# Patient Record
Sex: Male | Born: 1955 | Race: White | Hispanic: No | Smoking: Current every day smoker
Health system: Southern US, Community
[De-identification: ages and names within clinical notes are randomized; demographics above are authoritative.]

## PROBLEM LIST (undated history)

## (undated) DIAGNOSIS — I2510 Atherosclerotic heart disease of native coronary artery without angina pectoris: Secondary | ICD-10-CM

## (undated) DIAGNOSIS — J45909 Unspecified asthma, uncomplicated: Secondary | ICD-10-CM

## (undated) DIAGNOSIS — I255 Ischemic cardiomyopathy: Secondary | ICD-10-CM

## (undated) DIAGNOSIS — J449 Chronic obstructive pulmonary disease, unspecified: Secondary | ICD-10-CM

## (undated) DIAGNOSIS — I10 Essential (primary) hypertension: Secondary | ICD-10-CM

## (undated) HISTORY — PX: CARDIAC SURGERY: SHX584

## (undated) HISTORY — PX: CORONARY ARTERY BYPASS GRAFT: SHX141

---

## 2001-06-17 ENCOUNTER — Encounter: Payer: Self-pay | Admitting: Neurosurgery

## 2001-06-22 ENCOUNTER — Ambulatory Visit (HOSPITAL_COMMUNITY): Admission: RE | Admit: 2001-06-22 | Discharge: 2001-06-23 | Payer: Self-pay | Admitting: Neurosurgery

## 2001-06-22 ENCOUNTER — Encounter: Payer: Self-pay | Admitting: Neurosurgery

## 2001-07-14 ENCOUNTER — Ambulatory Visit (HOSPITAL_COMMUNITY): Admission: RE | Admit: 2001-07-14 | Discharge: 2001-07-14 | Payer: Self-pay | Admitting: Neurosurgery

## 2001-07-14 ENCOUNTER — Encounter: Payer: Self-pay | Admitting: Neurosurgery

## 2003-09-02 ENCOUNTER — Other Ambulatory Visit: Payer: Self-pay

## 2004-07-27 ENCOUNTER — Emergency Department: Payer: Self-pay | Admitting: Emergency Medicine

## 2005-04-25 ENCOUNTER — Emergency Department: Payer: Self-pay | Admitting: Emergency Medicine

## 2005-11-03 ENCOUNTER — Inpatient Hospital Stay: Payer: Self-pay | Admitting: Internal Medicine

## 2005-11-03 ENCOUNTER — Other Ambulatory Visit: Payer: Self-pay

## 2005-12-07 ENCOUNTER — Other Ambulatory Visit: Payer: Self-pay

## 2005-12-07 ENCOUNTER — Inpatient Hospital Stay: Payer: Self-pay | Admitting: Internal Medicine

## 2006-05-24 ENCOUNTER — Other Ambulatory Visit: Payer: Self-pay

## 2006-05-24 ENCOUNTER — Inpatient Hospital Stay: Payer: Self-pay | Admitting: Internal Medicine

## 2010-03-14 ENCOUNTER — Ambulatory Visit: Payer: Self-pay | Admitting: Internal Medicine

## 2010-08-05 ENCOUNTER — Inpatient Hospital Stay: Payer: Self-pay | Admitting: Internal Medicine

## 2010-12-25 ENCOUNTER — Emergency Department: Payer: Self-pay | Admitting: Emergency Medicine

## 2013-01-18 ENCOUNTER — Inpatient Hospital Stay: Payer: Self-pay | Admitting: Internal Medicine

## 2013-01-18 LAB — BASIC METABOLIC PANEL
Anion Gap: 7 (ref 7–16)
BUN: 12 mg/dL (ref 7–18)
Calcium, Total: 9.1 mg/dL (ref 8.5–10.1)
Chloride: 106 mmol/L (ref 98–107)
Co2: 26 mmol/L (ref 21–32)
Glucose: 87 mg/dL (ref 65–99)
Osmolality: 277 (ref 275–301)
Sodium: 139 mmol/L (ref 136–145)

## 2013-01-18 LAB — CK TOTAL AND CKMB (NOT AT ARMC): CK, Total: 76 U/L (ref 35–232)

## 2013-01-18 LAB — CBC WITH DIFFERENTIAL/PLATELET
Basophil #: 0.1 10*3/uL (ref 0.0–0.1)
Eosinophil #: 0.2 10*3/uL (ref 0.0–0.7)
Eosinophil %: 2.2 %
HCT: 46.4 % (ref 40.0–52.0)
Lymphocyte #: 2.1 10*3/uL (ref 1.0–3.6)
MCH: 30.8 pg (ref 26.0–34.0)
Monocyte %: 10.7 %
Neutrophil %: 66.6 %
WBC: 10.9 10*3/uL — ABNORMAL HIGH (ref 3.8–10.6)

## 2013-01-18 LAB — PROTIME-INR: Prothrombin Time: 12.6 secs (ref 11.5–14.7)

## 2013-01-19 LAB — LIPID PANEL
Cholesterol: 176 mg/dL (ref 0–200)
HDL Cholesterol: 33 mg/dL — ABNORMAL LOW (ref 40–60)
Ldl Cholesterol, Calc: 122 mg/dL — ABNORMAL HIGH (ref 0–100)
Triglycerides: 105 mg/dL (ref 0–200)
VLDL Cholesterol, Calc: 21 mg/dL (ref 5–40)

## 2013-01-19 LAB — COMPREHENSIVE METABOLIC PANEL
Albumin: 3.1 g/dL — ABNORMAL LOW (ref 3.4–5.0)
Anion Gap: 5 — ABNORMAL LOW (ref 7–16)
BUN: 11 mg/dL (ref 7–18)
Bilirubin,Total: 0.2 mg/dL (ref 0.2–1.0)
Calcium, Total: 8.4 mg/dL — ABNORMAL LOW (ref 8.5–10.1)
Chloride: 108 mmol/L — ABNORMAL HIGH (ref 98–107)
Creatinine: 0.79 mg/dL (ref 0.60–1.30)
EGFR (African American): >60
EGFR (Non-African Amer.): >60
Glucose: 81 mg/dL (ref 65–99)
Osmolality: 278 (ref 275–301)
SGOT(AST): 14 U/L — ABNORMAL LOW (ref 15–37)
Sodium: 140 mmol/L (ref 136–145)
Total Protein: 6.4 g/dL (ref 6.4–8.2)

## 2013-01-20 LAB — BASIC METABOLIC PANEL
Anion Gap: 3 — ABNORMAL LOW (ref 7–16)
Chloride: 103 mmol/L (ref 98–107)
Co2: 30 mmol/L (ref 21–32)
Potassium: 3.8 mmol/L (ref 3.5–5.1)

## 2013-02-24 ENCOUNTER — Ambulatory Visit: Payer: Self-pay | Admitting: Internal Medicine

## 2013-02-25 LAB — CK-MB: CK-MB: 2 ng/mL (ref 0.5–3.6)

## 2013-02-25 LAB — BASIC METABOLIC PANEL
Anion Gap: 1 — ABNORMAL LOW (ref 7–16)
BUN: 12 mg/dL (ref 7–18)
Calcium, Total: 8.7 mg/dL (ref 8.5–10.1)
Chloride: 104 mmol/L (ref 98–107)
Co2: 33 mmol/L — ABNORMAL HIGH (ref 21–32)
Creatinine: 0.79 mg/dL (ref 0.60–1.30)
EGFR (African American): >60
EGFR (Non-African Amer.): >60
Glucose: 89 mg/dL (ref 65–99)
Osmolality: 275 (ref 275–301)
Potassium: 4.1 mmol/L (ref 3.5–5.1)
Sodium: 138 mmol/L (ref 136–145)

## 2013-02-28 ENCOUNTER — Emergency Department: Payer: Self-pay | Admitting: Emergency Medicine

## 2013-03-21 ENCOUNTER — Ambulatory Visit: Payer: Self-pay | Admitting: Family Medicine

## 2013-03-23 ENCOUNTER — Ambulatory Visit: Payer: Self-pay | Admitting: Specialist

## 2013-03-31 ENCOUNTER — Ambulatory Visit: Payer: Self-pay | Admitting: Vascular Surgery

## 2013-03-31 LAB — CBC
HCT: 44.5 % (ref 40.0–52.0)
HGB: 15.6 g/dL (ref 13.0–18.0)
MCH: 31.5 pg (ref 26.0–34.0)
MCHC: 35 g/dL (ref 32.0–36.0)
MCV: 90 fL (ref 80–100)
Platelet: 242 10*3/uL (ref 150–440)
RBC: 4.94 10*6/uL (ref 4.40–5.90)
RDW: 14.2 % (ref 11.5–14.5)
WBC: 10.9 10*3/uL — ABNORMAL HIGH (ref 3.8–10.6)

## 2013-03-31 LAB — BASIC METABOLIC PANEL
Anion Gap: 4 — ABNORMAL LOW (ref 7–16)
BUN: 8 mg/dL (ref 7–18)
Calcium, Total: 9 mg/dL (ref 8.5–10.1)
Chloride: 105 mmol/L (ref 98–107)
Co2: 27 mmol/L (ref 21–32)
Creatinine: 0.52 mg/dL — ABNORMAL LOW (ref 0.60–1.30)
EGFR (African American): >60
EGFR (Non-African Amer.): >60
Glucose: 81 mg/dL (ref 65–99)
Osmolality: 269 (ref 275–301)
Potassium: 4 mmol/L (ref 3.5–5.1)
Sodium: 136 mmol/L (ref 136–145)

## 2013-04-07 ENCOUNTER — Inpatient Hospital Stay: Payer: Self-pay | Admitting: Vascular Surgery

## 2013-04-07 LAB — TROPONIN I: Troponin-I: <0.02 ng/mL

## 2013-04-07 LAB — CK TOTAL AND CKMB (NOT AT ARMC)
CK, Total: 78 U/L (ref 35–232)
CK-MB: 1.3 ng/mL (ref 0.5–3.6)

## 2013-04-07 LAB — MAGNESIUM: Magnesium: 1.8 mg/dL

## 2013-04-07 LAB — POTASSIUM: Potassium: 3.9 mmol/L (ref 3.5–5.1)

## 2013-04-08 LAB — CBC WITH DIFFERENTIAL/PLATELET
Basophil #: 0.1 x10 3/mm 3
Basophil %: 0.7 %
Eosinophil #: 0.1 x10 3/mm 3
Eosinophil %: 0.7 %
HCT: 39.7 % — ABNORMAL LOW
HGB: 13.8 g/dL
Lymphocyte %: 13 %
Lymphs Abs: 1.3 x10 3/mm 3
MCH: 31.3 pg
MCHC: 34.8 g/dL
MCV: 90 fL
Monocyte #: 0.8 "x10 3/mm "
Monocyte %: 8.2 %
Neutrophil #: 8 x10 3/mm 3 — ABNORMAL HIGH
Neutrophil %: 77.4 %
Platelet: 214 x10 3/mm 3
RBC: 4.42 x10 6/mm 3
RDW: 13.9 %
WBC: 10.3 x10 3/mm 3

## 2013-04-08 LAB — BASIC METABOLIC PANEL
BUN: 8 mg/dL (ref 7–18)
Calcium, Total: 8.3 mg/dL — ABNORMAL LOW (ref 8.5–10.1)
EGFR (Non-African Amer.): >60
Glucose: 112 mg/dL — ABNORMAL HIGH (ref 65–99)
Potassium: 3.9 mmol/L (ref 3.5–5.1)
Sodium: 139 mmol/L (ref 136–145)

## 2013-04-08 LAB — APTT: Activated PTT: 35.4 s

## 2013-04-08 LAB — TROPONIN I
Troponin-I: <0.02 ng/mL
Troponin-I: <0.02 ng/mL

## 2014-11-24 NOTE — Consult Note (Signed)
CHIEF COMPLAINT and HISTORY:  Subjective/Chief Complaint slurred speech, facial droop, right sided weakness   History of Present Illness 59 yo WM with COPD, CAD, admitted with slurred speech, confusion, facial issues and right sided weakness.  Most of symptoms have resolved over about 48 hours, but a little slurred speech seems to persist.  Weakness has resolved.  He was on ASA. He was found to have an approximately 90% left ICA stenosis by [place] and CT which I have reviewed.   PAST MEDICAL/SURGICAL HISTORY:  Past Medical History:   Bronchitis:    COPD:    Hypercholesterolemia:    mi:    blood clot:    open heart surgery:    cerv disk surg:   ALLERGIES:  Allergies:  Sulfa drugs: Rash  HOME MEDICATIONS:  Home Medications: Medication Instructions Status  atorvastatin 40 mg oral tablet 1 tab(s) orally once a day (at bedtime) Active  clopidogrel 75 mg oral tablet 1 tab(s) orally once a day Active  Aspirin Low Dose 81 mg oral delayed release tablet 1 tab(s) orally once a day Active  lisinopril 10 mg oral tablet 1 tab(s) orally once a day Active  Metoprolol Tartrate 25 mg oral tablet 1 tab(s) orally 2 times a day Active  Combivent Respimat CFC free 100 mcg-20 mcg/inh inhalation aerosol 1 puff(s) inhaled 4 times a day, As Needed - for Shortness of Breath Active   Family and Social History:  Family History Coronary Artery Disease  Hypertension  Smoking   Social History positive  tobacco   + Tobacco Current (within 1 year)   Place of Living Home   Review of Systems:  Fever/Chills No   Cough No   Sputum No   Abdominal Pain No   Diarrhea No   Constipation No   Nausea/Vomiting No   SOB/DOE Yes   Chest Pain No   Telemetry Reviewed NSR   Dysuria No   Tolerating PT Yes   Tolerating Diet Yes   Medications/Allergies Reviewed Medications/Allergies reviewed   Physical Exam:  GEN well developed, well nourished   HEENT pink conjunctivae, moist oral mucosa    NECK No masses  trachea midline   RESP normal resp effort  no use of accessory muscles   CARD regular rate  positive carotid bruits   VASCULAR ACCESS none   ABD denies tenderness  normal BS   LYMPH negative neck, negative axillae   EXTR negative cyanosis/clubbing, negative edema   SKIN No ulcers, skin turgor good   NEURO motor/sensory function intact, mild speech slurring at this point   [facility] alert, A+O to time, place, person   LABS:  Laboratory Results: LabObservation:    18-Jun-14 07:56, Echo Doppler  OBSERVATION   Reason for Test  Hepatic:    18-Jun-14 05:25, Comprehensive Metabolic Panel  Bilirubin, Total 0.2  Alkaline Phosphatase 77  SGPT (ALT) 14  SGOT (AST) 14  Total Protein, Serum 6.4  Albumin, Serum 3.1  Cardiology:    17-Jun-14 16:25, ECG  Ventricular Rate 69  Atrial Rate 258  QRS Duration 80  QT 404  QTc 432  P Axis 62  R Axis 11  T Axis -22  ECG interpretation   Atrial flutter with variable A-V block with premature ventricular or aberrantly conducted complexes  Nonspecific T wave abnormality  Abnormal ECG  When compared with ECG of 25-Dec-2010 22:15,  Atrial flutter has replaced Sinus rhythm  T wave inversion less evident in Inferior leads  ----------unconfirmed----------  Confirmed by OVERREAD, NOT ([id number]),  editor PEARSON, BARBARA ([id number]) on 01/19/2013 12:34:32 PM  ECG     18-Jun-14 07:56, Echo Doppler  Echo Doppler   REASON FOR EXAM:      COMMENTS:       PROCEDURE: [place] - ECHO DOPPLER COMPLETE(TRANSTHOR)  - Jan 19 2013  7:56AM     RESULT: Echocardiogram Report    Patient Name:   Mario Diaz Date of Exam: 01/19/2013  Medical Rec #:  [id number]             Custom1:  Date of Birth:  1956-04-29          Height:       68.0 in  Patient Age:    74 years           Weight:       172.0 lb  Patient Gender: M                  BSA:          1.92 m??    Indications: CVA  Sonographer:    Janalee Dane RCS  Referring Phys: Max Sane,  S    Summary:   1. Left ventricular ejection fraction, by visual estimation, is 35 to   40%.   2. Mildly to moderately decreased global left ventricular systolic   function.   3. Mild mitral valve regurgitation.   4. Mildly increased leftventricular internal cavity size.   5. Decreased left ventricular posterior wall thickness.   6. Mild tricuspid regurgitation.  2D AND M-MODE MEASUREMENTS (normal ranges within parentheses):  Left Ventricle:          Normal  IVSd (2D):      1.00 cm (0.7-1.1)  LVPWd (2D):     0.55 cm (0.7-1.1) Aorta/LA:                  Normal  LVIDd (2D):     5.38 cm (3.4-5.7) Aortic Root (2D): 3.80 cm (2.4-3.7)  LVIDs (2D):     4.48 cm           Left Atrium (2D): 4.70 cm (1.9-4.0)  LV FS (2D):     16.7 %   (>25%)  LV EF (2D):     34.7 %   (>50%)                                    Right Ventricle:                                    RVd (2D):  LV SYSTOLIC FUNCTION BY 2D PLANIMETRY (MOD):  EF-A4C View: 34.5 % EF-A2C View: 38.9 % EF-Biplane: 47.6 %  LV DIASTOLICFUNCTION:  MV Peak E: 0.50 m/s E/e' Ratio: 7.10  MV Peak A: 0.79 m/s Decel Time: 345 msec  E/A Ratio: 0.63  SPECTRAL DOPPLER ANALYSIS (where applicable):  Mitral Valve:  MV P1/2 Time: 100.05 msec  MV Area, PHT: 2.20 cm??  Tricuspid Valve and PA/RV Systolic Pressure: TR Max Velocity: 2.44 m/s RA   Pressure: 5 mmHg RVSP/PASP: 28.7 mmHg    PHYSICIAN INTERPRETATION:  Left Ventricle: The left ventricular internal cavity size was mildly   increased. LV posterior wall thickness was decreased. Global LV systolic   function was mildly to moderately decreased. Left ventricular ejection   fraction, by visual estimation, is 35 to 40%.  Right Ventricle: Normal right ventricular size, wall thickness, and   systolic function. RV wall thickness is normal.  Left Atrium: The left atrium is normal in size and structure.  Right Atrium: The right atrium is normal in size and structure.  Pericardium: There is no evidence of  pericardial effusion.  Mitral Valve: Mild mitral valve regurgitation is seen.  TricuspidValve: Mild tricuspid regurgitation is visualized. The   tricuspid regurgitant velocity is 2.44 m/s, and with an assumed right   atrial pressure of 5 mmHg, the estimated right ventricular systolic   pressure is normal at 28.7 mmHg.  Aortic Valve: Theaortic valve is trileaflet and structurally normal,   with normal leaflet excursion; without any evidence of aortic stenosis or   insufficiency.  Pulmonic Valve: Structurally normal pulmonic valve, with normal leaflet   excursion.  Aorta: The aortic root and ascending aorta are structurally normal, with   no evidence of dilitation.  Shunts: There is no evidence of a patent foramen ovale. There is no   evidence of an atrial septal defect.    [id number] Serafina Royals MD  Electronically signed by [id number] Serafina Royals MD  Signature Date/Time: 01/19/2013/12:46:27 PM    *** Final ***    IMPRESSION: .        Verified By: Corey Skains  ([facility]), M.D., MD  Routine Chem:    17-Jun-14 76:80, Basic Metabolic Panel (w/Total Calcium)  Glucose, Serum 87  BUN 12  Creatinine (comp) 0.87  Sodium, Serum 139  Potassium, Serum 3.8  Chloride, Serum 106  CO2, Serum 26  Calcium (Total), Serum 9.1  Anion Gap 7  Osmolality (calc) 277  eGFR (African American) >60  eGFR (Non-African American) >60  eGFR values <89m/min/1.73 m2 may be an indication of chronic  kidney disease (CKD).  Calculated eGFR is useful in patients with stable renal function.  The eGFR calculation will not be reliable in acutely ill patients  when serum creatinine is changing rapidly. It is not useful in   patients on dialysis. The eGFR calculation may not be applicable  to patients at the low and high extremes of body sizes, pregnant  women, and vegetarians.    18-Jun-14 05:25, Comprehensive Metabolic Panel  Glucose, Serum 81  BUN 11  Creatinine (comp) 0.79  Sodium, Serum 140   Potassium, Serum 3.9  Chloride, Serum 108  CO2, Serum 27  Calcium (Total), Serum 8.4  Osmolality (calc) 278  eGFR (African American) >60  eGFR (Non-African American) >60  eGFR values <667mmin/1.73 m2 may be an indication of chronic  kidney disease (CKD).  Calculated eGFR is useful in patients with stable renal function.  The eGFR calculation will not be reliable in acutely ill patients  when serum creatinine is changing rapidly. It is not useful in   patients on dialysis. The eGFR calculation may not be applicable  to patients at the low and high extremes of body sizes, pregnant  women, and vegetarians.  Anion Gap 5    18-Jun-14 05:25, Lipid Profile ([facility])  Cholesterol, Serum 176  Triglycerides, Serum 105  HDL (INHOUSE) 33  VLDL Cholesterol Calculated 21  LDL Cholesterol Calculated 122  Result(s) reported on 19 Jan 2013 at 06:13AM.    19-Jun-14 0888:11Basic Metabolic Panel (w/Total Calcium)  Glucose, Serum 91  BUN 7  Creatinine (comp) 0.82  Sodium, Serum 136  Potassium, Serum 3.8  Chloride, Serum 103  CO2, Serum 30  Calcium (Total), Serum 9.2  Anion Gap 3  Osmolality (calc) 270  eGFR (  African American) >60  eGFR (Non-African American) >60  eGFR values <71m/min/1.73 m2 may be an indication of chronic  kidney disease (CKD).  Calculated eGFR is useful in patients with stable renal function.  The eGFR calculation will not be reliable in acutely ill patients  when serum creatinine is changing rapidly. It is not useful in   patients on dialysis. The eGFR calculation may not be applicable  to patients at the low and high extremes of body sizes, pregnant  women, and vegetarians.  Cardiac:    17-Jun-14 16:21, Cardiac Panel  CK, Total 76  CPK-MB, Serum 1.1  Result(s) reported on 18 Jan 2013 at 08:05PM.    17-Jun-14 16:21, Troponin I  Troponin I < 0.02  0.00-0.05  0.05 ng/mL or less: NEGATIVE   Repeat testing in 3-6 hrs   if clinically indicated.  >0.05 ng/mL:  POTENTIAL   MYOCARDIAL INJURY. Repeat   testing in 3-6 hrs if   clinically indicated.  NOTE: An increase or decrease   of 30% or more on serial   testing suggests a   clinically important change  Routine Coag:    17-Jun-14 16:21, Prothrombin Time  Prothrombin 12.6  INR 0.9  INR reference interval applies to patients on anticoagulant therapy.  A single INR therapeutic range for coumarins is not optimal for all  indications; however, the suggested range for most indications is  2.0 - 3.0.  Exceptions to the INR Reference Range may include: Prosthetic heart  valves, acute myocardial infarction, prevention of myocardial  infarction, and combinations of aspirin and anticoagulant. The need  for a higher or lower target INR must be assessed individually.  Reference: The Pharmacology and Management of the Vitamin K   antagonists: the seventh ACCP Conference on Antithrombotic and  Thrombolytic Therapy. CQPYPP.5093Sept:126 (3suppl): [id number]  A HCT value >55% may artifactually increase the PT.  In one study,   the increase was an average of 25%.  Reference:  "Effect on Routine and Special Coagulation Testing Values  of Citrate Anticoagulant Adjustment in Patients with High HCT Values."  American Journal of Clinical Pathology 2006;126:400-405.  Routine Hem:    17-Jun-14 16:21, CBC Profile  WBC (CBC) 10.9  RBC (CBC) 5.15  Hemoglobin (CBC) 15.8  Hematocrit (CBC) 46.4  Platelet Count (CBC) 220  MCV 90  MCH 30.8  MCHC 34.2  RDW 14.2  Neutrophil % 66.6  Lymphocyte % 19.3  Monocyte % 10.7  Eosinophil % 2.2  Basophil % 1.2  Neutrophil # 7.3  Lymphocyte # 2.1  Monocyte # 1.2  Eosinophil # 0.2  Basophil # 0.1  Result(s) reported on 18 Jan 2013 at 04:53PM.   RADIOLOGY:  Radiology Results: [place]    17-Jun-14 19:23, [place]  [place] Bilateral  REASON FOR EXAM:    cva  COMMENTS:       PROCEDURE: [place] - [place] DOPPL[place] BILATERAL  - Jan 18 2013   7:23PM     RESULT:     Findings: Mixed, smooth and calcified plaque is identified within the   common carotid artery on the right and left demonstrating less than 50%   stenosis. Calcified plaque is identified within the carotid bulbs   bilaterally demonstrating less than 50% stenosis. Calcified plaques are   identified within the internal carotid artery on the right demonstrating   less than 50% stenosis. Smooth plaque is identified within the proximal   left internal carotid artery demonstrating visually greater than 75%  stenosis.  Color filling and SPECTRAL waveform imaging within the right system is   unremarkable. Evaluation of the left system demonstrates color filling of   vessels. There is diffuse SPECTRAL broadening within the proximal portion   of the left internal carotid artery consistent with increased laminar   flow in the region of the smoothly marginated mural plaque. Color filling   on the lumen is identified within this area though minimal. There does   not appear to be evidence of flow reversal. Markedly elevated velocities   are identified within the mid left internal carotid artery of 289.1   cm/sec.    ICA/CCAratios:    Right: 1.13  Left: 6.23  Antegrade flow is identified within the right and left vertebral arteries.    IMPRESSION:      1. Sonographic findings consistent with high-grade stenosis within the   proximal portion of the left internal carotid artery on the magnitude of   75% to 95%.  2. No evidence of hemodynamically significant stenosis within the right   carotid system.   3. Vascular Interventional surgical consultation is recommended.    Thank you for the opportunity to contribute to the care of your patient.         Verified By: Mikki Santee, M.D., MD  LabUnknown:    17-Jun-14 16:00, CT Head Without Contrast  PACS Image    17-Jun-14 19:23, US Carotid Doppler Bilateral  PACS Image    18-Jun-14 15:16, MRI Brain Without Contrast   PACS Image    18-Jun-14 16:04, CT Angiography Neck (Carotids)  PACS Image    19-Jun-14 10:33, CT Chest With Contrast  PACS Image  MRI:    18-Jun-14 15:16, MRI Brain Without Contrast  MRI Brain Without Contrast  REASON FOR EXAM:    CVA  COMMENTS:       PROCEDURE: MR  - MR BRAIN WO CONTRAST  - Jan 19 2013  3:16PM     RESULT: History: CVA.    Comparison Study: Head CT of 01/18/2013.    Findings: Multiplanar, multisequence images brain is obtained. Acute   infarct noted in the left posterior temporal lobe is present. White   matter changes consistent with chronic ischemia. No mass or   hydrocephalus.Vascular flow are normal. Mucosal thickening noted in the   ethmoidal and maxillary sinuses.  IMPRESSION:  Acute infarct left temporal parietal lobe.        Verified By: Osa Craver, M.D., MD  CT:    17-Jun-14 16:00, CT Head Without Contrast  CT Head Without Contrast  REASON FOR EXAM:    possible CVA  COMMENTS:       PROCEDURE: CT  - CT HEAD WITHOUT CONTRAST  - Jan 18 2013  4:00PM     RESULT: Noncontrast emergent CT of the brain is performed. The patient   has no previous exam for comparison. The ventricles and sulci are normal.   There is no hemorrhage. There is no focal mass, mass-effect or midline   shift. There is no evidence of edema or territorial infarct. The bone   windows demonstrate normal aeration of the paranasal sinuses and mastoid   air cells. There is no skull fracture demonstrated.    IMPRESSION:    1. No acute intracranial abnormality.  Dictation Site: 1        Verified By: Sundra Aland, M.D., MD    18-Jun-14 16:04, CT Angiography Neck (Carotids)  CT Angiography Neck (Carotids)  REASON FOR EXAM:  carotid stenosis, stroke  COMMENTS:       PROCEDURE: CT  - CT ANGIOGRAPHY NECK W/CONTRAST  - Jan 19 2013  4:04PM     RESULT: History: Stroke. Carotid stenosis.    Comparison Study: Carotid ultrasound 01/18/2013.    Findings: Standard CTA  obtained with 100 cc of Isovue-370. Evaluation   with vessel view software performed. Evaluation of the axial source   images and MIP images performed. Volume rendered images reviewed. Soft   tissue density noted left external auditory canal most consistent with   cerumen. Mild wall thickening is noted of the upper thoracic esophagus.   Esophagitis could present in this fashion. Esophageal malignancy cannot     be entirely excluded. A barium swallow can be obtained for further   evaluation.Pleural parenchymal thickening is noted in the left upper   lobe. This is adjacent to a median sternotomy most likely represents   scarring however chest CT should be considered to exclude significant   mass lesion. Bullous COPD present. Prior cervical spine fusion.     Visualized thoracic arch is unremarkable.    Right carotid system is widely patent with mild calcified plaque carotid   bifurcation.    High-grade 90% stenosis present in the proximal left internal carotid   artery just at the left carotid bifurcation.    Vertebrals are patent. Right vertebral is dominant. Visualized     intracranial vessels  widely patent.    IMPRESSION:    1. High-grade left proximal internal carotid 90% stenosis.  2. Mild thickening of the proximal esophagus. Barium swallow is suggested   to exclude focal esophageal lesion.  3. Ill-defined pleural-parenchymal thickening in the left anterior chest   adjacent to median sternotomy is most likely scarring. Chest CT is   suggested to further evaluate for thepossibility of mass lesion. COPD.        Verified By: Osa Craver, M.D., MD    19-Jun-14 10:33, CT Chest With Contrast  CT Chest With Contrast  REASON FOR EXAM:    lung mass  COMMENTS:       PROCEDURE: CT  - CT CHEST WITH CONTRAST  - Jan 20 2013 10:33AM     RESULT: History: Lung mass.    Comparison Study: CTA 01/19/2013.    Findings: Standard CT of the chest is obtained. Shotty mediastinal lymph    nodes are noted. A thoracic aorta normal caliber. Adrenals are normal.   Prior CABG. Borderline cardiomegaly. Pulmonary arteries are normal.   Patient's had a prior median sternotomy with diastases of the sternum   with broken wires. Present identified soft tissue thickening noted   posterior to the sternum is most likely related to scarring. Severe     bullous COPD is noted. An ill-defined 1.3 cm density is present in the   superior segment of the right lower lobe seen on lung windows image#53.   Description or focal area of pneumonia however focal malignancy cannot be   excluded. Prominent atelectasis and/or pneumonia is noted in the right   lung base. Adrenals are normal. No acute bony abnormalities.    IMPRESSION:    1. 1.3 cm ill-defined density right lower lobe. This could represent a   small lung cancer. Small focus of pneumonia could present in this fashion.  2. Right lower lobe atelectasis and/or pneumonia.  3. Previously identified pleural thickening posterior to the sternum is   most likely related to scarring. There is diastases of the sternum.   Sternotomy  wires are broken.    A followup CT and 2 to 4 weeks should be considered. If the  densities in     the right lung base do not clear, PET CT may prove useful for further   evaluation.        Verified By: Osa Craver, M.D., MD   ASSESSMENT AND PLAN:  Assessment/Admission Diagnosis high grade left ICA stenosis with stroke tobacco dependence COPD CAD   Plan discussed options for treatment.   With high grade symptomatic stenosis, stroke risk reduction favors intervention.  His anatomy is favorable for left CEA.  He appears to be acceptable risk for surgery, but will likely need cardiac clearance as an outpatient.   Generally wait about two weeks following stroke to reduce risk of reperfusion injury. Will arrange left CEA as outpatient in 2 weeks.  level 4   Electronic Signatures: Algernon Huxley (MD)   (Signed 19-Jun-14 13:32)  Authored: Chief Complaint and History, PAST MEDICAL/SURGICAL HISTORY, ALLERGIES, HOME MEDICATIONS, Family and Social History, Review of Systems, Physical Exam, LABS, RADIOLOGY, Assessment and Plan   Last Updated: 19-Jun-14 13:32 by Algernon Huxley (MD)

## 2014-11-24 NOTE — Op Note (Signed)
PATIENT NAME:  Mario Diaz, Mario Diaz MR#:  [id number] DATE OF BIRTH:  [birth year]  DATE OF PROCEDURE:  04/07/2013  PREOPERATIVE DIAGNOSES: 1.  Symptomatic high-grade left carotid artery stenosis with previous stroke.  2.  Coronary disease, status post drug-eluting stent recently.  3.  Chronic obstructive pulmonary disease.   POSTOPERATIVE DIAGNOSES: 1.  Symptomatic high-grade left carotid artery stenosis with previous stroke.  2.  Coronary disease, status post drug-eluting stent recently.  3.  Chronic obstructive pulmonary disease.   PROCEDURE: 1.  Catheter placement into the left internal carotid artery from right femoral approach.  2.  Thoracic aortogram and selective left cervical and cerebral carotid arteriogram.  3.  Placement of a 9 x 7 Xact stent with the use of the NAV6 Embolic Protection device to the left internal carotid artery.  4.  StarClose Closure Device of right femoral artery.   SURGEON: Annice NeedyJason S Chyenne Sobczak, MD   ANESTHESIA: Local with moderate conscious sedation.   ESTIMATED BLOOD LOSS: Approximately 25 mL.   INDICATION FOR PROCEDURE: This is a 59 year old white male with a greater than 90% left carotid artery stenosis. He had a stroke about 3 months ago and has mild residual deficits but has largely recovered. In his cardiac workup for carotid intervention, he was found to have significant coronary disease and recently had a drug-eluting stent and will need to remain on Plavix. Due to his heart disease and the markedly increased risk of bleeding on Plavix, he was felt to be pretty high risk for open carotid surgery, and we elected to perform carotid artery stenting. The risks and benefits were discussed with the patient, and he desired to proceed.   DESCRIPTION OF PROCEDURE: The patient was brought to the vascular interventional radiology suite. Groins were shaved and prepped and a sterile surgical field was created. The right femoral head was localized with fluoroscopy, and the right  femoral artery was accessed without difficulty with a Seldinger needle. A J-wire and 6-French sheaths were placed. The patient was systemically heparinized with 5000 units of intravenous heparin, and his ACT was found to be greater than 300 seconds. A pigtail catheter was placed in the ascending aorta, and an LAO projection thoracic aortogram was performed. This showed normal origins of the great vessels with a moderate reverse curve of the left common carotid artery. A Bentson-Hannafee Catheter was used when the JB-2 catheter would not track into the common carotid artery.  At this point, selective cervical and cerebral carotid angiography was performed. This showed a nearly occlusive stenosis of the left internal carotid artery of 95 to 98% with a smaller internal carotid artery distally and minimal intracerebral flow.  Only some sluggish middle cerebral filling was seen. No anterior cerebral filling was seen.  A Stiff Wire was placed into the external carotid artery, and a 6-French shuttle sheath was placed over the Stiff Wire.  The wire and dilator were then removed. The small NAV6 Embolic Protection Device was then used to cross the lesion, which was done without difficulty, and the embolic protection device was parked in the distal internal carotid artery.  A 97, 4 cm long Xact stent was then selected. It was brought into the field, brought across the lesion.  Selective angiogram was performed, and the stent was deployed in excellent location encompassing the lesion.  It was postdilated with a 5 mm balloon with pretreatment with atropine. He still had slowing of his heart rate afterwards, and an additional dose of atropine was given. The initial  images after stent placement showed significant spasm.  The Embolic Protection Device was then retrieved, which improved but did not resolve the spasm. The stent was widely patent with excellent flow and no significant residual stenosis. The intracerebral filling was  much more brisk in the middle cerebral artery, but there was still no anterior cerebral artery filling seen. At this point, we elected to terminate the procedure. The sheaths were pulled back to the ipsilateral external iliac artery, and oblique arteriogram was performed. StarClose closure device was deployed in the usual fashion with excellent hemostatic result. The patient tolerated the procedure well and was taken to the recovery room in stable condition.    ____________________________ Annice Needy, MD jsd:cb D: 04/07/2013 16:33:59 ET T: 04/07/2013 17:49:49 ET JOB#: [id number]  cc: Annice Needy, MD, <Dictator> Annice Needy MD ELECTRONICALLY SIGNED 04/27/2013 11:37

## 2014-11-24 NOTE — Consult Note (Signed)
Chief Complaint:  Subjective/Chief Complaint Pt doing reasonable well today. No cp no sob. R groin ok.   VITAL SIGNS/ANCILLARY NOTES: **Vital Signs.:   25-Jul-14 07:00  Vital Signs Type Routine  Temperature Temperature (F) 98  Celsius 36.6  Temperature Source oral  Pulse Pulse 68  Respirations Respirations 24  Systolic BP Systolic BP 161  Diastolic BP (mmHg) Diastolic BP (mmHg) 72  Mean BP 84  Pulse Ox % Pulse Ox % 94  Pulse Ox Activity Level  At rest  Oxygen Delivery 2L  Pulse Ox Heart Rate 68  *Intake and Output.:   Daily 25-Jul-14 07:00  Grand Totals Intake:  1750 Output:  3250    Net:  -1500 24 Hr.:  -1500  IV (Primary)      In:  1750  Urine ml     Out:  3250  Length of Stay Totals Intake:  1750 Output:  3250    Net:  -1500   Brief Assessment:  GEN well developed, well nourished, no acute distress   Cardiac Regular  murmur present  -- JVD   Respiratory normal resp effort   Gastrointestinal Normal   Gastrointestinal details normal Soft   EXTR negative cyanosis/clubbing, negative edema   Lab Results: Cardiac Catherization:  24-Jul-14 12:03   Cardiac Catheterization  [facility] [place], [place] [address] [phone]   Cardiovascular Catheterization Comprehensive Report   Patient: ANGELDEJESUS CALLAHAM Study date: 02/24/2013 MR number: [id number] Account number: [id number]   DOB: [birth year] Age: 59 years Gender: Male Race: White Height: 68.1 in Weight: 167.6 lb   Diagnostic Cardiologist:  Serafina Royals, MD   SUMMARY:   -IMPRESSIONS: Progressive angina with abnormal stress test with inferior ishcemia and apical lv dysfuncion with ef 40% Occluded grafts with significant stenosis of rca   CORONARY CIRCULATION: Ostial left main: There was a 30 % stenosis. Proximal LAD: There was a 60 % stenosis. In a second lesion, there was a 40 % stenosis. Mid LAD: There was a 30 %stenosis. Proximal circumflex: There was a 50 % stenosis. In a  second lesion, there was a 100 % stenosis. Proximal ramus intermedius: There was a 60 % stenosis. Proximal RCA: There was a 50 % stenosis. Mid RCA: There was a 30 % stenosis. In a second lesion, there was a 90 % stenosis. Distal RCA: There was a 25 % stenosis. RECOMMENDATIONS: -PCI of rca   INDICATIONS: Angina/MI: stable angina.   HISTORY: The patient has hypertension, a history of current cigarette use, and a family history of coronary artery disease. There was no history of diabetes. PRIOR CARDIOVASCULAR PROCEDURES: No history of coronary or graft percutaneous intervention.   PROCEDURE: The risks and alternatives of the procedures and conscious sedation were explained to the patient and informed consent was obtained. The patient was brought to the cath lab and placed on the table. The planned puncture sites were prepped and draped in the usual sterile fashion. PROCEDURE COMPLETION: There were no complications. TIMING: Test started at 12:20. Test concluded at 12:37.   Prepared and signed by   Serafina Royals, MD Signed 02/24/2013 12:58:11   STUDY DIAGRAM   Angiographic findings Native coronary lesions:  Ostial left main: Lesion 1: 30 % stenosis.  Proximal LAD: Lesion 1: 60 % stenosis. Lesion 2: 40 % stenosis.  Mid LAD: Lesion 1: 30 % stenosis. Proximal circumflex: Lesion 1: 50 % stenosis. Lesion 2: 100 % stenosis.  Proximal RI: Lesion 1: 60 % stenosis.  Proximal RCA: Lesion  1: 50 %stenosis.  Mid RCA: Lesion 1: 30 % stenosis. Lesion 2: 90 % stenosis.  Distal RCA: Lesion 1: 25 % stenosis.   HEMODYNAMIC TABLES   Pressures:  Baseline Pressures:  - HR: 66 Pressures:  - Rhythm: Pressures:  -- Aortic Pressure (S/D/M): 103/45/25 Pressures:  -- Left Ventricle (s/edp): 97/9/--   Outputs:  Baseline Outputs:  -- CALCULATIONS[age] Outputs:  -- CALCULATIONS: Body Surface Area: 1.90 Outputs:  -- CALCULATIONS: Height in cm: 173.00 Outputs:  -- CALCULATIONS:Sex:  Male Outputs:  -- CALCULATIONS: Weight in kg: 76.20  Cardiology:  24-Jul-14 13:51   Ventricular Rate 60  Atrial Rate 60  P-R Interval 230  QRS Duration 92  QT 442  QTc 442  P Axis 58  R Axis 78  T Axis 101  ECG interpretation Sinus rhythm with 1st degree A-V block Incomplete right bundle branch block Nonspecific T wave abnormality Abnormal ECG When compared with ECG of 18-Jan-2013 16:25, Sinus rhythm has replaced Atrial flutter Questionable change in QRS axis ----------unconfirmed---------- Confirmed by OVERREAD, NOT ([id number]), editor PEARSON, BARBARA ([id number]) on 02/24/2013 2:31:02 PM  Routine Chem:  25-Jul-14 00:25   Glucose, Serum 89  BUN 12  Creatinine (comp) 0.79  Sodium, Serum 138  Potassium, Serum 4.1  Chloride, Serum 104  CO2, Serum  33  Calcium (Total), Serum 8.7  Anion Gap  1  Osmolality (calc) 275  eGFR (African American) >60  eGFR (Non-African American) >60 (eGFR values <65mL/min/1.73 m2 may be an indication of chronic kidney disease (CKD). Calculated eGFR is useful in patients with stable renal function. The eGFR calculation will not be reliable in acutely ill patients when serum creatinine is changing rapidly. It is not useful in  patients on dialysis. The eGFR calculation may not be applicable to patients at the low and high extremes of body sizes, pregnant women, and vegetarians.)  Cardiac:  25-Jul-14 00:25   CPK-MB, Serum 2.0 (Result(s) reported on 25 Feb 2013 at 01:01AM.)   Radiology Results: Cardiac Catherization:    24-Jul-14 12:03, Cardiac Catheterization  Cardiac Catheterization   [facility]  [place], [place] [address]  [phone]     Cardiovascular Catheterization Comprehensive Report     Patient: JOEANTHONY SEELING  Study date: 02/24/2013  MR number: [id number]  Account number: [id number]     DOB: 12/09/1955  Age: 22 years  Gender: Male  Race: White  Height: 68.1 in  Weight: 167.6 lb     Diagnostic Cardiologist:   Serafina Royals, MD     SUMMARY:     -IMPRESSIONS: Progressive angina with abnormal stress test with  inferior ishcemia and apical lv dysfuncion with ef 40%  Occluded grafts with significant stenosis of rca     CORONARY CIRCULATION: Ostial left main: There was a 30 % stenosis.  Proximal LAD: There was a 60 % stenosis. In a second lesion, there  was a 40 % stenosis. Mid LAD: There was a 30 %stenosis. Proximal  circumflex: There was a 50 % stenosis. In a second lesion, there was  a 100 % stenosis. Proximal ramus intermedius: There was a 60 %  stenosis. Proximal RCA: There was a 50 % stenosis. Mid RCA: There was  a 30 % stenosis. In a second lesion, there was a 90 % stenosis.  Distal RCA: There was a 25 % stenosis.  RECOMMENDATIONS: -PCI of rca     INDICATIONS: Angina/MI: stable angina.     HISTORY: The patient has hypertension,  a history of current cigarette  use, and a family history of coronary artery disease. There was no  history of diabetes. PRIOR CARDIOVASCULAR PROCEDURES: No history of  coronary or graft percutaneous intervention.     PROCEDURE: The risks and alternatives of the procedures and conscious  sedation were explained to the patient and informed consent was  obtained. The patient was brought to the cath lab and placed on the  table. The planned puncture sites were prepped and draped in the  usual sterile fashion.  PROCEDURE COMPLETION: There were no complications. TIMING: Test  started at 12:20. Test concluded at 12:37.     Prepared and signed by     Serafina Royals, MD  Signed 02/24/2013 12:58:11     STUDY DIAGRAM     Angiographic findings  Native coronary lesions:   Ostial left main: Lesion 1: 30 % stenosis.   Proximal LAD: Lesion 1: 60 % stenosis. Lesion 2: 40 % stenosis.   Mid LAD: Lesion 1: 30 % stenosis.  Proximal circumflex: Lesion 1: 50 % stenosis. Lesion 2: 100 %  stenosis.   Proximal RI: Lesion 1: 60 % stenosis.   Proximal RCA: Lesion 1: 50  %stenosis.   Mid RCA: Lesion 1: 30 % stenosis. Lesion 2: 90 % stenosis.   Distal RCA: Lesion 1: 25 % stenosis.     HEMODYNAMIC TABLES     Pressures:  Baseline  Pressures:  - HR: 66  Pressures:  - Rhythm:  Pressures:  -- Aortic Pressure (S/D/M): 103/45/25  Pressures:  -- Left Ventricle (s/edp): 97/9/--     Outputs:  Baseline  Outputs:  -- CALCULATIONS: [age]  Outputs:  -- CALCULATIONS: Body Surface Area: 1.90  Outputs:  -- CALCULATIONS: Height in cm: 173.00  Outputs:  -- CALCULATIONS:Sex: Male  Outputs:  -- CALCULATIONS: Weight in kg: 76.20  Cardiology:    24-Jul-14 13:51, ECG  Ventricular Rate 60  Atrial Rate 60  P-R Interval 230  QRS Duration 92  QT 442  QTc 442  P Axis 58  R Axis 78  T Axis 101  ECG interpretation   Sinus rhythm with 1st degree A-V block  Incomplete right bundle branch block  Nonspecific T wave abnormality  Abnormal ECG  When compared with ECG of 18-Jan-2013 16:25,  Sinus rhythm has replaced Atrial flutter  Questionable change in QRS axis  ----------unconfirmed----------  Confirmed by OVERREAD, NOT ([id number]), editor PEARSON, BARBARA ([id number]) on 02/24/2013 2:31:02 PM  ECG    Assessment/Plan:  Assessment/Plan:  Assessment IMP S/P PCI DES CAD Angina HTN Hyperlipidemia Possible SSS PVCs/NSVT Bradycardia .   Plan PLAN ASA 325 mg daily x 3 months then $RemoveBef'81mg'PBRbNClZYh$  daily Plavix 75 mg daily x 1 yr Agree with lipitor for chol Increase activity with ambulation Hold off on B-blockers because of brady No heavy lifting for 1-2 weeks  F/U Bk 1-2 week Ok to d/c home today , f/u with [facility] about NSVT/PVCs   Electronic Signatures: Lujean Amel D (MD)  (Signed 25-Jul-14 10:46)  Authored: Chief Complaint, VITAL SIGNS/ANCILLARY NOTES, Brief Assessment, Lab Results, Radiology Results, Assessment/Plan   Last Updated: 25-Jul-14 10:46 by Lujean Amel D (MD)

## 2014-11-24 NOTE — Consult Note (Signed)
   General Aspect This is a 59 year old male that had a left endarterectomy this a.m. with history of ongoing tobacco abuse CAD, status post bypass with 3 grafts  2011 and PCI and stent placement of right coronary artery end of July 2014.Consultation requested on patient due to bradycardia, hypotension after surgery. Patient did receive atropine and is currently on  dopamine drip with heart rate still in the 40s, low 50s and blood pressure around 100 systolic.  Initially his EKG did show Mobitz type I with inferior T wave inversion.  Now,  it appears patient is in third-degree heart block. The T wave inversion is new compared to his last EKG in the office.  Patient states he did not take his aspirin and Plavix this a.m. and last time he took it was 8 AM September 3. He appears comfortable at this time and denies any chest pain or shortness of breath.  He is just complaining of some nausea that has actually improved which he contributes secondary to the IV medication.   Physical Exam:  GEN well developed, no acute distress   HEENT pink conjunctivae, moist oral mucosa   RESP normal resp effort   CARD Bradycardic  with heart block   ABD denies tenderness   EXTR negative edema   SKIN normal to palpation, skin turgor good   NEURO cranial nerves intact, motor/sensory function intact   PSYCH alert, A+O to time, place, person, good insight   Review of Systems:  Subjective/Chief Complaint Nausea   Neck: left carotid intervention   Gastrointestinal: Nausea  no vomiting   Review of Systems: All other systems were reviewed and found to be negative   Medications/Allergies Reviewed Medications/Allergies reviewed    Sulfa drugs: Rash  Vital Signs/Nurse's Notes: **Vital Signs.:   04-Sep-14 15:00  Vital Signs Type Routine  Temperature Source oral  Pulse Pulse 52  Respirations Respirations 17  Systolic BP Systolic BP 83  Diastolic BP (mmHg) Diastolic BP (mmHg) 68  Mean BP 73  Pulse Ox %  Pulse Ox % 93  Pulse Ox Activity Level  At rest  Oxygen Delivery 2L  Pulse Ox Heart Rate 52    Impression 59 year old male with history of CAD, new stent of the right coronary in July, PAD ongoing tobacco abuse status post endarterectomy this a.m. with postop bradycardia hypotension, T-wave inversion in the inferior leads on EKG with probable complete heart block.   Plan 1.  Patient will receive his aspirin and Plavix now since he did not take his routine aspirin and Plavix prior to surgery this a.m., which is worrisome with a new drug alluding stent placed approximately 6-7 weeks ago. 2. Inferior T wave inversion noted on EKG postop, which is new compared to his last EKG done in this office and worrisome since he missed his antiplatelets this a.m. Stat troponin and cardiac panel x 3 to assess for NSTEMI. 3.  Continue dopamine drip for support of heart rate and blood pressure. 4.  Continue to watch rhythm closely for need of  external pacemaker.  Patient was discussed with Dr. Gwen PoundsKowalski who agrees with the above plan and  also be rounding on patient this pm.   Electronic Signatures: Rudi Cocoarroll, Donna (NP)  (Signed 04-Sep-14 17:26)  Authored: General Aspect/Present Illness, History and Physical Exam, Review of System, Allergies, Vital Signs/Nurse's Notes, Impression/Plan   Last Updated: 04-Sep-14 17:26 by Rudi Cocoarroll, Donna (NP)

## 2014-11-24 NOTE — Discharge Summary (Signed)
PATIENT NAME:  Mario SpellerCAPPS, Trumaine W MR#:  [id number] DATE OF BIRTH:  07-Aug-1955  ADMISSION DIAGNOSES:  1.  High-grade symptomatic left carotid artery stenosis.  2.  Bradycardia.  3.  Chronic obstructive pulmonary disease.  4.  History of myocardial infarction with coronary disease.  5.  History of stroke in June 2014.   POSTOPERATIVE DIAGNOSES:  1.  High-grade symptomatic left carotid artery stenosis.  2.  Bradycardia.  3.  Chronic obstructive pulmonary disease.  4.  History of myocardial infarction with coronary disease.  5.  History of stroke in June 2014.   PROCEDURE: Left carotid artery stent placement.   BRIEF HISTORY: A 59 year old white male with previous diagnosis of high-grade left carotid artery stenosis. He had a stroke about 2 to 3 months prior to his intervention. He has largely recovered except for some minor speech deficits and facial issues. He was admitted for procedure.   HOSPITAL COURSE: The patient was admitted to same-day surgery where a left carotid artery stent placed was placed. He had recently had a drug-eluting stent placement and needed to remain on Plavix and had a significant cardiac history, and a stent was preferred over open surgery for cardiac risk reduction; in addition, he has good anatomy for carotid artery stenting and a reasonable risk profile for such.   The carotid artery stent went well, but he developed bradycardia during and after the procedure. He was admitted to the Critical Care Unit, which was standard, and started on low-dose dopamine to help his cardiac rhythm and blood pressure. He did not have syncope or chest pain. He had no neurologic deficits. No access site-related complications. His bradycardia was quite slow to resolve, and he was on dopamine for several days. He was followed by cardiology while he was in the hospital. His troponin was normal and he did not have any chest pain suggesting angina, so no cardiac catheterization was required.    Postprocedure day 4, his heart rate had come up into the 50s and 60s and he was normotensive off of dopamine and was then deemed stable for discharge.   He will be discharged home accompanied by family.   DIET: Regular.   ACTIVITY: As tolerated.   DISCHARGE MEDICATIONS: He will resume all previous home medications at this point which include atorvastatin 40 mg daily, aspirin 81 mg daily, clopidogrel 75 mg daily, lisinopril 10 mg daily, loratadine 10 mg daily, and Atrovent inhaler. No beta blocker is used because of bradycardia.   FOLLOWUP: He will return to the office in 3 to 4 weeks for duplex and will call or contact our office with problems in the interim.    ____________________________ Annice NeedyJason S. Dew, MD jsd:np D: 04/27/2013 13:37:43 ET T: 04/27/2013 16:28:40 ET JOB#: [id number]  cc: Annice NeedyJason S. Dew, MD, <Dictator> Annice NeedyJASON S DEW MD ELECTRONICALLY SIGNED 05/02/2013 12:05

## 2014-11-24 NOTE — Discharge Summary (Signed)
PATIENT NAME:  Mario Diaz, Mario Diaz MR#:  [id number] DATE OF BIRTH:  March 29, 1956  DATE OF ADMISSION:  01/18/2013 DATE OF DISCHARGE:  01/20/2013  ADMISSION DIAGNOSIS: Acute cerebrovascular accident.   DISCHARGE DIAGNOSES: 1.  Left acute parietal temporal cerebrovascular accident. 2.  Cardiomyopathy, ejection fraction of 35% to 40%.  3.  History of atrial flutter.  4.  Possible lung mass.  5.  Hypertension. 6.  History of coronary artery disease.  7.  Tobacco dependence.   LABORATORY AND DIAGNOSTICS: Discharge sodium 136, potassium 3.8, chloride 103, bicarb 30, BUN 7, creatinine 0.82, glucose 91.   Troponins were negative.   LDL 122, cholesterol 176, HDL 33.   CT of the head showed no acute intracranial hemorrhage or CVA.  MRI of the brain showed an acute infarct in the left temporal parietal lobe.   A 2-D echocardiogram showed an EF of 35% and 40% with mild to moderately decreased left ventricular systolic function with mild TR.   CTA of the neck:   1.  High grade left proximal internal carotid stenosis.  2.  Mild thickening in the proximal esophagus.  Barium swallow is suggested. 3.  Ill-defined parenchymal thickening in the left anterior chest adjacent to median sternotomy, most likely scarring.   CT of the chest showed a 1.3 cm ill-defined density in right lower lobe. This could represent a small lung cancer. A small focus of pneumonia could also present in this fashion.   CONSULTANTS:  1.  Festus BarrenJason Dew, MD. 2.  Arnoldo HookerBruce Kowalski, MD.   HOSPITAL COURSE: A 59 year old male with a history of CAD and smoking dependence who presented with left facial droop.  For further details, please refer to the H and P.  1.  Acute CVA.  The patient had dysarthria and left facial droop. He underwent CVA work-up including MRI of the brain, carotid Dopplers and echocardiogram. The MRI was positive for acute left temporal parietal CVA. His Dopplers were also positive for high-grade stenosis in the left internal  carotid artery. Dr. Wyn Quakerew was consulted who recommended a CTA, which also confirmed high grade left proximal internal carotid 90% stenosis. His echocardiogram showed cardiomyopathy. He was placed on Plavix as he is on aspirin daily. He will need a CEA to the left internal carotid artery, which due to his acute stroke will be performed within 2 to 3 weeks with Dr. Wyn Quakerew. The patient will need home PT and speech evaluation.  2.  Cardiomyopathy, EF 35% to 40% with history of CAD, CABG and atrial flutter. Dr. Gwen PoundsKowalski was consulted. His atrial flutter actually was short-lived and he is in normal sinus rhythm. He will continue on ACE inhibitor and metoprolol.  3.  Smoking dependence. The patient was encouraged to stop smoking. He was counseled but not want a nicotine patch at discharge.  4.  Possible lung cancer. CTA mentioned an abnormal lucency seen in the lung fields, so we ordered a CT of the chest which showed a small possible lung mass. The patient will need follow-up with Dr. Meredeth IdeFleming, which is arranged prior to discharge.  He will need a repeat CT scan in 2 to 4 weeks. There is also some mention on the CTA about something in the esophagus and they recommended a barium so this will be deferred as an outpatient.  5.  COPD, which was stable.  DISCHARGE MEDICATIONS: 1.  Aspirin 81 mg daily.  2.  Lisinopril 10 mg daily.  3.  Metoprolol 25 mg b.i.d.  4.  Combivent 4  times a day p.r.n.  5.  Atorvastatin 40 mg at bedtime.  6.  Plavix 75 mg daily.   DISCHARGE HOME HEALTH:  Physical therapy, nurse, nurse aide and speech.   DISCHARGE DIET: Low sodium.   DISCHARGE ACTIVITY: As tolerated, but no exertion or heavy lifting until seen by Dr. Wyn Quaker.   DISCHARGE FOLLOWUP:  With Dr. Wyn Quaker in 2 weeks as well as [facility] in 2 weeks. The patient will also follow up with Dr. Meredeth Ide in 2 weeks for the abnormal CT scan.  His primary care physician at [facility] should investigate his abnormal CTA, about his esophagus,  and as recommended a barium study. This can be done as an outpatient.  TIME SPENT: Approximately 45 minutes.  ____________________________ Janyth Contes. Juliene Pina, MD spm:sb D: 01/20/2013 12:47:34 ET T: 01/20/2013 13:27:20 ET JOB#: [id number]  cc: Loree Shehata P. Juliene Pina, MD, <Dictator> [facility] Annice Needy, MD Herbon E. Meredeth Ide, MD Janyth Contes Antron Seth MD ELECTRONICALLY SIGNED 01/20/2013 21:54

## 2014-11-24 NOTE — Discharge Summary (Signed)
PATIENT NAME:  Mario SpellerCAPPS, Joshoa W MR#:  [id number] DATE OF BIRTH:  1956/03/01  DATE OF ADMISSION:  02/24/2013 DATE OF DISCHARGE:    DISCHARGE DIAGNOSES: 1.  Unstable angina.  2.  Coronary artery disease.  3.  Previous myocardial infarction.  4.  Hypertension.  5.  Hyperlipidemia.   PROCEDURES: The patient underwent a cardiac catheterization showing occluded bypass grafts with significant stenosis of right coronary artery. The patient had PCI and drug-eluting stent of the mid to distal right coronary artery of an 85% to 90% stenosis. The patient had no complications.   HISTORY OF PRESENT ILLNESS: This is a 59 year old male, with known hypertension, hyperlipidemia, coronary artery disease, previous myocardial infarction, who has had progressive anginal symptoms, who has had episode of stress test showing inferior myocardial ischemia. Cardiac catheterization showed significant right coronary artery stenosis and underwent a PCI and stent placement using a drug-eluting stent of the distal right coronary artery without complication. He was doing fine without any other significant symptoms and received the maximal hospital benefit after ambulation.   DISCHARGE MEDICATIONS:  He was discharged on appropriate medications including Plavix 75 mg each day, lisinopril 10 mg each day, aspirin 325 mg each day, Lipitor 40 mg each day.   DISCHARGE INSTRUCTIONS:  He is to avoid beta blocker due to bradycardia and follow up with Dr. Arnoldo HookerBruce Kowalski in 2 weeks.    ____________________________ Lamar BlinksBruce J. Kowalski, MD bjk:cc D: 02/24/2013 14:07:00 ET T: 02/24/2013 16:32:01 ET JOB#: [id number]  cc: Lamar BlinksBruce J. Kowalski, MD, <Dictator> Lamar BlinksBRUCE J KOWALSKI MD ELECTRONICALLY SIGNED 03/11/2013 14:07

## 2014-11-24 NOTE — H&P (Signed)
PATIENT NAME:  Mario Diaz, Mario Diaz MR#:  [id number] DATE OF BIRTH:  17-Mar-1956  DATE OF ADMISSION:  01/18/2013  PRIMARY CARE PHYSICIAN:  [facility]   CARDIOLOGIST:   Dr. Darrold Junker at [facility]   REQUESTING PHYSICIAN:  Dr.  Governor Rooks  CHIEF COMPLAINT:  Facial weakness and slurred speech.   HISTORY OF PRESENT ILLNESS:  The patient is a 59 year old male with a known history of coronary artery disease, status post CABG, hyperlipidemia, COPD, who is being admitted for possible stroke. The patient was feeling confused this afternoon and had fuzzy eyes and some visual disturbances. He went to his neighbor's home, where he was stuttering during his walk and had slurred speech noticed by his neighbor. They brought him down to the Emergency Department. While in the ED, he was evaluated by [facility] and was felt not appropriate for t-PA. He is being admitted for further evaluation and management, as he still has some weakness on his right side, but he feels the majority of symptoms are resolved. He does have some intermittent speech disturbances also.   PAST MEDICAL HISTORY: 1.  History of coronary artery disease, status post MI in 2005. Had CABG done last year, as per him, at [facility].  2.  Hyperlipidemia.   3.  Tobacco abuse.   4.  History of vertigo.  5.  COPD, with ongoing smoking.   PAST SURGICAL HISTORY:  CABG.    ALLERGIES:  SULFA.   FAMILY HISTORY:  Brother had MI at the age of 33. Mother also had MI at the age of 4.   SOCIAL HISTORY:  He lives alone. Smokes 1 pack of cigarettes daily for the last 40 to 45 years. Denies any alcohol.   MEDICATIONS AT HOME: 1.  Aspirin 81 mg p.o. daily.  2.  Combivent 1 puff inhaled 4 times a day as needed.  3.  Lisinopril 10 mg p.o. daily.  4.  Metoprolol 25 mg p.o. b.i.d.   REVIEW OF SYSTEMS:   CONSTITUTIONAL:  No fever, fatigue, weakness.  EYES: He did have some blurry and fuzzy vision earlier today, which has resolved. No history  of glaucoma or cataracts. EARS, NOSE, THROAT:   No tinnitus, ear pain.  RESPIRATORY:  No cough, wheezing, hemoptysis.  CARDIOVASCULAR:  No chest pain, orthopnea, edema.  GASTROINTESTINAL:  No nausea, vomiting, diarrhea.  GENITOURINARY:  No dysuria or hematuria.  ENDOCRINE:  No polyuria or nocturia.  HEMATOLOGY:  No anemia or easy bruising.  SKIN:  No rash or lesion.  MUSCULOSKELETAL:  No arthritis or muscle cramp.  NEUROLOGIC: Had some right-sided weakness in his lower extremity and some staggering gait early on, which seemed to have been resolved. He still has some facial weakness on the right, along with some droop on the right side of the face also.  PSYCHIATRIC:  No history of anxiety or depression.   PHYSICAL EXAMINATION: VITAL SIGNS:  Temperature 97.3, heart rate 71 per minute, respirations 20 per minute, blood pressure 153/60 mmHg, saturating 96% on room air.  GENERAL:  The patient is a 59 year old male lying in bed comfortably without any acute distress.  EYES: Pupils equal, round, reactive to light and accommodation. No scleral icterus. Extraocular muscles intact.  HEENT:   Head atraumatic, normocephalic.  Oropharynx and nasopharynx clear. Neck Supple, No JVD. No thyroid enlargement or tenderness.  LUNGS:  Clear to auscultation bilaterally. No wheezing, rales, rhonchi or crepitation. He has a CABG scar present in the midsternal area.  CARDIOVASCULAR:  S1,  S2 normal. No murmurs, rubs or gallop.  ABDOMEN: Soft, nontender, nondistended. Bowel sounds present. No organomegaly or masses. CHEST WALL:  He has some minimal tenderness around his left chest wall, which he feels that he had a muscle strain as he was pulling out weeds last Friday.  SKIN:  No obvious rash, lesion, ulcer.  EXTREMITIES: No Pedal Edema, cyanosis or clubbing.  NEUROLOGIC: Normal finger-to-nose test. Cranial nerves II to XII seem intact. Muscle strength 5/5 in all extremities. Sensation intact. He does seem to have some  minimal right-sided facial droop and occasional speech disturbance time to time. A little bit slow to respond at times also. His sensation seems intact.  PSYCHIATRIC:  The patient is alert and oriented x 3.   DATA:  EKG shows atrial flutter, with variable AV block, nonspecific ST-T changes. CT scan of the head without contrast in the ED showed no acute intracranial abnormality.   IMPRESSION AND PLAN: 1.  New cerebrovascular accident. Will start him on full-dose aspirin and statin. Get Physical Therapy  and Speech Therapy evaluation. Will get MRI of the brain and carotid Dopplers at this time. Will check TSH.   2.  Chronic obstructive pulmonary disease, with ongoing tobacco abuse. Will continue Combivent. Will hold off steroids, as he does not have any wheezing.   3.  Coronary artery disease, status post coronary artery bypass graft. Seems stable at this time. Will monitor on telemetry. He does not have any chest pain.  4.  Atrial flutter. Not sure that this is new. Will consult Cardiology for further evaluation. His rate seems controlled at this time. Will let Cardiology decide further management.   5.  Tobacco abuse. He was counseled for about 3 minutes. He denies a need for nicotine replacement therapy at this time.   6.  Code status:  FULL CODE.   Total time taking care of this patient is 55 minutes.    ____________________________ Ellamae SiaVipul S. Sherryll BurgerShah, MD vss:mr D: 01/18/2013 18:32:10 ET T: 01/18/2013 20:21:38 ET JOB#: [id number]  cc: Donnivan Villena S. Sherryll BurgerShah, MD, <Dictator> [facility] Marcina MillardAlexander Paraschos, MD   Ellamae SiaVIPUL S [facility] MD ELECTRONICALLY SIGNED 01/19/2013 12:45

## 2014-11-24 NOTE — Consult Note (Signed)
PATIENT NAME:  Mario SpellerCAPPS, Nemiah W MR#:  [id number] DATE OF BIRTH:  1956-05-02  DATE OF CONSULTATION:  01/19/2013  REFERRING PHYSICIAN: Katharina Caperima Vaickute, MD    CONSULTING PHYSICIAN:  Lamar BlinksBruce J. Carden Teel, MD  REASON FOR CONSULTATION: Atrial flutter, coronary artery disease, hypertension, hyperlipidemia, coronary artery bypass graft with recent stroke.   CHIEF COMPLAINT: " I think I've had a stroke."   HISTORY OF PRESENT ILLNESS: This is a 59 year old male with known coronary artery disease, status post previous myocardial infarction and coronary artery bypass graft in 2011, who has had hyperlipidemia on appropriate medications. He has recently had an acute onset of stroke with slurred speech and other significant issues and some weakness.  The patient has had slight improvement of these symptoms over the last 24 hours, but there are clear stroke-like issues. The patient has had new onset severe carotid atherosclerosis possibly contributing to above and may need further intervention. The patient has had no current evidence of anginal symptoms, congestive heart failure over the last several weeks to a month consistent with absence of heart failure or true angina. He has been on appropriate medication management including statin, aspirin for above. Currently, he has an EKG showing atrial flutter with preventricular contractions and slow ventricular rate but no evidence of heart block or significant symptoms of syncope. Troponin level is normal.   REVIEW OF SYSTEMS: The remainder of review of systems cannot be assessed due to the patient's inability for speech.   PAST MEDICAL HISTORY: 1.  Atrial flutter.  2.  Coronary artery disease.  3.  Old myocardial infarction.  4.  Hyperlipidemia.  5.  Bypass surgery.  6.  Stroke.   FAMILY HISTORY: Multiple family members with early onset of cardiovascular disease or hypertension.   SOCIAL HISTORY: The patient has tobacco and minimal alcohol use.   ALLERGIES: As  listed.   MEDICATIONS: As listed.   PHYSICAL EXAMINATION: VITAL SIGNS: Blood pressure is 122/68 bilaterally, heart rate is 48 upright, 54 reclining and irregular.  GENERAL: He is a well-appearing male in no acute distress.  HEENT: No icterus, thyromegaly, ulcers, hemorrhage or xanthelasma.  CARDIOVASCULAR: Irregularly irregular. Normal S1, S2. A 2 out of 6 apical murmur consistent with mitral regurgitation. PMI is diffuse. Carotid upstroke normal  bruit. Jugular venous pressure is normal.  LUNGS: Lungs have a few basilar crackles with normal respirations.  ABDOMEN: Soft, nontender, without hepatosplenomegaly or masses. Abdominal aorta is normal size without bruit.  EXTREMITIES: 2+ radial, femoral, dorsal pedal pulses with no lower extremity  NEUROLOGICAL: The patient is having stroke-like symptoms.   ASSESSMENT: A 59 year old male with hypertension, hyperlipidemia, old myocardial infarction, coronary artery disease with acute onset of stroke-like symptoms and atrial flutter with controlled ventricular rate.   RECOMMENDATIONS: 1.  Continue to follow with telemetry for extent of atrial flutter and atrial fibrillation concerning for sick sinus syndrome.  2.  Anticoagulation when able for further risk reduction in stroke with atrial fibrillation, although may need further surgical intervention from thrombotic-type stroke or carotid atherosclerosis.  3.  Echocardiogram for LV systolic dysfunction, valvular heart disease contributing to atrial fibrillation and previous myocardial infarction.  4.  Reinstatement of hypertension control and hyperlipidemia control including statin and ACE inhibitor, if able, and  would avoid beta blocker at this time due to concerns of bradycardia.  5.  Further aspirin and/or Plavix for peripheral vascular disease and stroke until and/or if necessary until the patient has further intervention of carotid atherosclerosis.     6.  Ambulation and further treatment thereof  as necessary.    ____________________________ Lamar Blinks, MD bjk:cb D: 01/19/2013 13:55:07 ET T: 01/19/2013 14:44:16 ET JOB#: [id number]  cc: Lamar Blinks, MD, <Dictator> Lamar Blinks MD ELECTRONICALLY SIGNED 01/27/2013 8:48

## 2015-05-07 DIAGNOSIS — I951 Orthostatic hypotension: Secondary | ICD-10-CM | POA: Insufficient documentation

## 2015-05-07 DIAGNOSIS — Z7982 Long term (current) use of aspirin: Secondary | ICD-10-CM | POA: Insufficient documentation

## 2015-05-07 DIAGNOSIS — I6521 Occlusion and stenosis of right carotid artery: Secondary | ICD-10-CM | POA: Diagnosis not present

## 2015-05-07 DIAGNOSIS — I5022 Chronic systolic (congestive) heart failure: Secondary | ICD-10-CM | POA: Insufficient documentation

## 2015-05-07 DIAGNOSIS — J45909 Unspecified asthma, uncomplicated: Secondary | ICD-10-CM | POA: Diagnosis not present

## 2015-05-07 DIAGNOSIS — J449 Chronic obstructive pulmonary disease, unspecified: Secondary | ICD-10-CM | POA: Insufficient documentation

## 2015-05-07 DIAGNOSIS — I739 Peripheral vascular disease, unspecified: Secondary | ICD-10-CM | POA: Diagnosis not present

## 2015-05-07 DIAGNOSIS — I2510 Atherosclerotic heart disease of native coronary artery without angina pectoris: Secondary | ICD-10-CM | POA: Insufficient documentation

## 2015-05-07 DIAGNOSIS — I110 Hypertensive heart disease with heart failure: Secondary | ICD-10-CM | POA: Diagnosis not present

## 2015-05-07 DIAGNOSIS — R531 Weakness: Secondary | ICD-10-CM | POA: Diagnosis not present

## 2015-05-07 DIAGNOSIS — R61 Generalized hyperhidrosis: Secondary | ICD-10-CM | POA: Insufficient documentation

## 2015-05-07 DIAGNOSIS — Z79899 Other long term (current) drug therapy: Secondary | ICD-10-CM | POA: Diagnosis not present

## 2015-05-07 DIAGNOSIS — F17200 Nicotine dependence, unspecified, uncomplicated: Secondary | ICD-10-CM | POA: Insufficient documentation

## 2015-05-07 DIAGNOSIS — Z8249 Family history of ischemic heart disease and other diseases of the circulatory system: Secondary | ICD-10-CM | POA: Insufficient documentation

## 2015-05-07 DIAGNOSIS — I493 Ventricular premature depolarization: Secondary | ICD-10-CM | POA: Diagnosis present

## 2015-05-07 DIAGNOSIS — Z8673 Personal history of transient ischemic attack (TIA), and cerebral infarction without residual deficits: Secondary | ICD-10-CM | POA: Diagnosis not present

## 2015-05-07 DIAGNOSIS — R918 Other nonspecific abnormal finding of lung field: Secondary | ICD-10-CM | POA: Diagnosis not present

## 2015-05-07 DIAGNOSIS — R55 Syncope and collapse: Secondary | ICD-10-CM | POA: Diagnosis not present

## 2015-05-07 DIAGNOSIS — R0902 Hypoxemia: Secondary | ICD-10-CM | POA: Insufficient documentation

## 2015-05-07 DIAGNOSIS — E860 Dehydration: Secondary | ICD-10-CM | POA: Diagnosis not present

## 2015-05-07 DIAGNOSIS — Z951 Presence of aortocoronary bypass graft: Secondary | ICD-10-CM | POA: Diagnosis not present

## 2015-05-07 DIAGNOSIS — I255 Ischemic cardiomyopathy: Secondary | ICD-10-CM | POA: Insufficient documentation

## 2015-05-07 DIAGNOSIS — R079 Chest pain, unspecified: Secondary | ICD-10-CM | POA: Insufficient documentation

## 2015-05-07 DIAGNOSIS — R42 Dizziness and giddiness: Secondary | ICD-10-CM | POA: Diagnosis present

## 2015-05-07 DIAGNOSIS — Z882 Allergy status to sulfonamides status: Secondary | ICD-10-CM | POA: Insufficient documentation

## 2015-05-07 DIAGNOSIS — R001 Bradycardia, unspecified: Secondary | ICD-10-CM | POA: Insufficient documentation

## 2015-05-07 DIAGNOSIS — Z981 Arthrodesis status: Secondary | ICD-10-CM | POA: Diagnosis not present

## 2015-05-07 LAB — URINALYSIS COMPLETE WITH MICROSCOPIC (ARMC ONLY)
BACTERIA UA: NONE SEEN
BILIRUBIN URINE: NEGATIVE
Glucose, UA: NEGATIVE mg/dL
Hgb urine dipstick: NEGATIVE
Leukocytes, UA: NEGATIVE
Nitrite: NEGATIVE
PH: 5 (ref 5.0–8.0)
PROTEIN: NEGATIVE mg/dL
Specific Gravity, Urine: 1.015 (ref 1.005–1.030)

## 2015-05-07 LAB — BASIC METABOLIC PANEL
ANION GAP: 8 (ref 5–15)
BUN: 8 mg/dL (ref 6–20)
CALCIUM: 9.2 mg/dL (ref 8.9–10.3)
CO2: 27 mmol/L (ref 22–32)
CREATININE: 0.88 mg/dL (ref 0.61–1.24)
Chloride: 97 mmol/L — ABNORMAL LOW (ref 101–111)
GFR calc Af Amer: >60 mL/min (ref >60)
GFR calc non Af Amer: >60 mL/min (ref >60)
GLUCOSE: 97 mg/dL (ref 65–99)
Potassium: 3.8 mmol/L (ref 3.5–5.1)
Sodium: 132 mmol/L — ABNORMAL LOW (ref 135–145)

## 2015-05-07 LAB — CBC
HCT: 47.9 % (ref 40.0–52.0)
HEMOGLOBIN: 16.1 g/dL (ref 13.0–18.0)
MCH: 30.2 pg (ref 26.0–34.0)
MCHC: 33.5 g/dL (ref 32.0–36.0)
MCV: 90.2 fL (ref 80.0–100.0)
Platelets: 228 10*3/uL (ref 150–440)
RBC: 5.32 MIL/uL (ref 4.40–5.90)
RDW: 14.2 % (ref 11.5–14.5)
WBC: 10.5 10*3/uL (ref 3.8–10.6)

## 2015-05-07 LAB — TROPONIN I: Troponin I: <0.03 ng/mL (ref <0.031)

## 2015-05-07 NOTE — ED Notes (Signed)
Pt c/o sudden onset dizziness, with left sided chest pain with diaphoresis that started about 1hr PTA..states he got here and while in the parking lot with the air conditioning on he began to fell better.

## 2015-05-08 ENCOUNTER — Observation Stay
Admission: EM | Admit: 2015-05-08 | Discharge: 2015-05-09 | Disposition: A | Discharge status: Home / Self Care | Payer: Medicare HMO | Attending: Internal Medicine | Admitting: Internal Medicine

## 2015-05-08 ENCOUNTER — Encounter: Payer: Self-pay | Admitting: Internal Medicine

## 2015-05-08 ENCOUNTER — Observation Stay: Payer: Medicare HMO

## 2015-05-08 ENCOUNTER — Observation Stay
Admit: 2015-05-08 | Discharge: 2015-05-08 | Disposition: A | Discharge status: Home / Self Care | Payer: Medicare HMO | Attending: Internal Medicine | Admitting: Internal Medicine

## 2015-05-08 ENCOUNTER — Emergency Department: Payer: Medicare HMO

## 2015-05-08 DIAGNOSIS — R55 Syncope and collapse: Secondary | ICD-10-CM | POA: Diagnosis present

## 2015-05-08 DIAGNOSIS — I10 Essential (primary) hypertension: Secondary | ICD-10-CM

## 2015-05-08 DIAGNOSIS — J449 Chronic obstructive pulmonary disease, unspecified: Secondary | ICD-10-CM

## 2015-05-08 DIAGNOSIS — I493 Ventricular premature depolarization: Secondary | ICD-10-CM

## 2015-05-08 DIAGNOSIS — R42 Dizziness and giddiness: Secondary | ICD-10-CM

## 2015-05-08 DIAGNOSIS — I2510 Atherosclerotic heart disease of native coronary artery without angina pectoris: Secondary | ICD-10-CM

## 2015-05-08 DIAGNOSIS — R0902 Hypoxemia: Secondary | ICD-10-CM

## 2015-05-08 DIAGNOSIS — I255 Ischemic cardiomyopathy: Secondary | ICD-10-CM

## 2015-05-08 HISTORY — DX: Ischemic cardiomyopathy: I25.5

## 2015-05-08 HISTORY — DX: Atherosclerotic heart disease of native coronary artery without angina pectoris: I25.10

## 2015-05-08 HISTORY — DX: Unspecified asthma, uncomplicated: J45.909

## 2015-05-08 HISTORY — DX: Chronic obstructive pulmonary disease, unspecified: J44.9

## 2015-05-08 HISTORY — DX: Essential (primary) hypertension: I10

## 2015-05-08 LAB — TSH: TSH: 0.447 u[IU]/mL (ref 0.350–4.500)

## 2015-05-08 LAB — CREATININE, SERUM: Creatinine, Ser: 0.72 mg/dL (ref 0.61–1.24)

## 2015-05-08 LAB — CBC
HEMATOCRIT: 46.3 % (ref 40.0–52.0)
HEMOGLOBIN: 15.6 g/dL (ref 13.0–18.0)
MCH: 30.5 pg (ref 26.0–34.0)
MCHC: 33.7 g/dL (ref 32.0–36.0)
MCV: 90.5 fL (ref 80.0–100.0)
Platelets: 200 10*3/uL (ref 150–440)
RBC: 5.11 MIL/uL (ref 4.40–5.90)
RDW: 14.3 % (ref 11.5–14.5)
WBC: 8.4 10*3/uL (ref 3.8–10.6)

## 2015-05-08 LAB — TROPONIN I

## 2015-05-08 LAB — MAGNESIUM: MAGNESIUM: 2 mg/dL (ref 1.7–2.4)

## 2015-05-08 MED ORDER — SODIUM CHLORIDE 0.9 % IV SOLN
INTRAVENOUS | Status: DC
  Administered 2015-05-08 (×2): via INTRAVENOUS

## 2015-05-08 MED ORDER — ACETAMINOPHEN 500 MG PO TABS: 500.0000 mg | ORAL_TABLET | Freq: Four times a day (QID) | ORAL | Status: DC | PRN

## 2015-05-08 MED ORDER — ONDANSETRON HCL 4 MG/2ML IJ SOLN: 4.0000 mg | Freq: Four times a day (QID) | INTRAMUSCULAR | Status: DC | PRN

## 2015-05-08 MED ORDER — ASPIRIN EC 81 MG PO TBEC
81.0000 mg | DELAYED_RELEASE_TABLET | Freq: Every day | ORAL | Status: DC
  Administered 2015-05-08 – 2015-05-09 (×2): 81 mg via ORAL
  Filled 2015-05-08 (×2): qty 1

## 2015-05-08 MED ORDER — ENOXAPARIN SODIUM 40 MG/0.4ML ~~LOC~~ SOLN
40.0000 mg | SUBCUTANEOUS | Status: DC
  Administered 2015-05-08 – 2015-05-09 (×2): 40 mg via SUBCUTANEOUS
  Filled 2015-05-08 (×2): qty 0.4

## 2015-05-08 MED ORDER — IPRATROPIUM-ALBUTEROL 0.5-2.5 (3) MG/3ML IN SOLN
3.0000 mL | Freq: Once | RESPIRATORY_TRACT | Status: AC
  Administered 2015-05-08: 3 mL via RESPIRATORY_TRACT
  Filled 2015-05-08: qty 3

## 2015-05-08 MED ORDER — SODIUM CHLORIDE 0.9 % IJ SOLN
3.0000 mL | Freq: Two times a day (BID) | INTRAMUSCULAR | Status: DC
  Administered 2015-05-08: 3 mL via INTRAVENOUS

## 2015-05-08 MED ORDER — SODIUM CHLORIDE 0.9 % IJ SOLN: 3.0000 mL | INTRAMUSCULAR | Status: DC | PRN

## 2015-05-08 MED ORDER — SODIUM CHLORIDE 0.9 % IJ SOLN: 3.0000 mL | Freq: Two times a day (BID) | INTRAMUSCULAR | Status: DC

## 2015-05-08 MED ORDER — SODIUM CHLORIDE 0.9 % IV SOLN
250.0000 mL | INTRAVENOUS | Status: DC | PRN
Start: 2015-05-08 — End: 2015-05-08

## 2015-05-08 MED ORDER — ASPIRIN 81 MG PO TABS: 81.0000 mg | ORAL_TABLET | Freq: Every day | ORAL | Status: DC

## 2015-05-08 MED ORDER — ONDANSETRON HCL 4 MG PO TABS: 4.0000 mg | ORAL_TABLET | Freq: Four times a day (QID) | ORAL | Status: DC | PRN

## 2015-05-08 MED ORDER — LISINOPRIL 10 MG PO TABS
10.0000 mg | ORAL_TABLET | Freq: Every day | ORAL | Status: DC
  Administered 2015-05-09: 10 mg via ORAL
  Filled 2015-05-08: qty 1

## 2015-05-08 MED ORDER — IPRATROPIUM-ALBUTEROL 0.5-2.5 (3) MG/3ML IN SOLN
3.0000 mL | Freq: Four times a day (QID) | RESPIRATORY_TRACT | Status: DC
  Administered 2015-05-08 – 2015-05-09 (×5): 3 mL via RESPIRATORY_TRACT
  Filled 2015-05-08 (×5): qty 3

## 2015-05-08 NOTE — Consult Note (Signed)
[facility] Consultation Note  Patient ID: Mario Diaz, MRN: [id number], DOB/AGE: 1955/10/23 59 y.o. Admit date: 05/08/2015   Date of Consult: 05/08/2015 Primary Physician: [facility] Primary Cardiologist: Gwen Pounds  Chief Complaint:  Chief Complaint  Patient presents with  . Near Syncope   Reason for Consult: pre-syncope weakness congestive heart failure with bradycardia  HPI: 59 y.o. male with known artery disease status post coronary artery bypass graft with severe LV systolic dysfunction and chronic systolic dysfunction heart failure with ejection fraction of 20%. The patient does have appropriate treatment of hypertension with ACE inhibitor and has had an episode of near syncope for which he was weak fatigue and almost passed out. When seen in the emergency room he had a normal troponin and no evidence of myocardial infarction and had an EKG showing sinus bradycardia with very frequent preventricular contractions. The patient has had no evidence of signs of worsening congestive heart failure and/or anginal equivalent at this time. Relating at this time and therefore cannot assess for the possibility of chronotropic incompetence and/or hypotension with medication management. After he has had some hydration he feels somewhat better  Past Medical History  Diagnosis Date  . Hypertension   . Asthma   . COPD (chronic obstructive pulmonary disease) (HCC)   . Coronary artery disease   . Ischemic cardiomyopathy       Surgical History:  Past Surgical History  Procedure Laterality Date  . Cardiac surgery      double bipass  . Coronary artery bypass graft       Home Meds: Prior to Admission medications   Medication Sig Start Date End Date Taking? Authorizing Provider  acetaminophen (TYLENOL) 500 MG tablet Take 1 tablet by mouth as needed. 02/05/15  Yes Historical Provider, MD  aspirin 81 MG tablet Take 1 tablet by mouth daily. 02/05/15  Yes Historical  Provider, MD  lisinopril (PRINIVIL,ZESTRIL) 10 MG tablet Take 1 tablet by mouth daily. 02/01/15  Yes Historical Provider, MD    Inpatient Medications:  . aspirin EC  81 mg Oral Daily  . enoxaparin (LOVENOX) injection  40 mg Subcutaneous Q24H  . ipratropium-albuterol  3 mL Nebulization Q6H  . lisinopril  10 mg Oral Daily  . sodium chloride  3 mL Intravenous Q12H  . sodium chloride  3 mL Intravenous Q12H   . sodium chloride 75 mL/hr at 05/08/15 0919    Allergies:  Allergies  Allergen Reactions  . Sulfa Antibiotics Rash    Social History   Social History  . Marital Status: Divorced    Spouse Name: N/A  . Number of Children: N/A  . Years of Education: N/A   Occupational History  . Not on file.   Social History Main Topics  . Smoking status: Current Every Day Smoker    Types: Cigarettes  . Smokeless tobacco: Never Used  . Alcohol Use: Yes  . Drug Use: No  . Sexual Activity: No   Other Topics Concern  . Not on file   Social History Narrative     Family History  Problem Relation Age of Onset  . CAD Mother   . CAD Father   . Cancer Sister      Review of Systems Positive for presyncope and/or weakness Negative for: General:  chills, fever, night sweats or weight changes.  Cardiovascular: PND orthopnea  dizziness  Dermatological skin lesions rashes Respiratory: Cough congestion Urologic: Frequent urination urination at night and hematuria Abdominal: negative for nausea, vomiting, diarrhea,  bright red blood per rectum, melena, or hematemesis Neurologic: negative for visual changes, and/or hearing changes  All other systems reviewed and are otherwise negative except as noted above.  Labs:  Recent Labs  05/07/15 1822 05/08/15 0036 05/08/15 0844  TROPONINI <0.03 <0.03 <0.03   Lab Results  Component Value Date   WBC 8.4 05/08/2015   HGB 15.6 05/08/2015   HCT 46.3 05/08/2015   MCV 90.5 05/08/2015   PLT 200 05/08/2015    Recent Labs Lab 05/07/15 1822  05/08/15 0844  NA 132*  --   K 3.8  --   CL 97*  --   CO2 27  --   BUN 8  --   CREATININE 0.88 0.72  CALCIUM 9.2  --   GLUCOSE 97  --    Lab Results  Component Value Date   CHOL 176 01/19/2013   HDL 33* 01/19/2013   LDLCALC 122* 01/19/2013   TRIG 105 01/19/2013   No results found for: DDIMER  Radiology/Studies:  Dg Chest 2 View  05/08/2015   CLINICAL DATA:  New acute onset of dizziness and left-sided chest pain. Diaphoresis. Initial encounter.  EXAM: CHEST  2 VIEW  COMPARISON:  Chest radiograph performed 08/05/2010, and CT of the chest performed 03/23/2013  FINDINGS: The lungs are hyperexpanded, with flattening of the hemidiaphragms, compatible with COPD. Peribronchial thickening is noted. There is no evidence of focal opacification, pleural effusion or pneumothorax. Bilateral nipple shadows are seen.  The heart is normal in size. The patient is status post median sternotomy, with prior removal of multiple sternal wires. No acute osseous abnormalities are seen. Cervical spinal fusion hardware is noted.  IMPRESSION: Findings of COPD.  No acute focal airspace consolidation seen.   Electronically Signed   By: Roanna Raider M.D.   On: 05/08/2015 01:49   Ct Head Wo Contrast  05/08/2015   CLINICAL DATA:  Acute onset of dizziness.  Initial encounter.  EXAM: CT HEAD WITHOUT CONTRAST  TECHNIQUE: Contiguous axial images were obtained from the base of the skull through the vertex without intravenous contrast.  COMPARISON:  CT of the head performed 01/18/2013, and MRI of the brain performed 01/19/2013  FINDINGS: There is no evidence of acute infarction, mass lesion, or intra- or extra-axial hemorrhage on CT.  Prominence of the sulci suggests mild cortical volume loss. Mild periventricular white matter change likely reflects small vessel ischemic microangiopathy.  The brainstem and fourth ventricle are within normal limits. The basal ganglia are unremarkable in appearance. The cerebral hemispheres  demonstrate grossly normal gray-white differentiation. No mass effect or midline shift is seen.  There is no evidence of fracture; visualized osseous structures are unremarkable in appearance. The visualized portions of the orbits are within normal limits. The paranasal sinuses and mastoid air cells are well-aerated. No significant soft tissue abnormalities are seen.  IMPRESSION: 1. No acute intracranial pathology seen on CT. 2. Mild cortical volume loss and mild small vessel ischemic microangiopathy.   Electronically Signed   By: Roanna Raider M.D.   On: 05/08/2015 01:54    EKG: Normal sinus rhythm with a heart rate of the 58 bpm with very frequent preventricular contractions and anterior precordial T-wave inversion  Weights: Filed Weights   05/07/15 1817 05/08/15 0808  Weight: 165 lb (74.844 kg) 150 lb 14.4 oz (68.448 kg)     Physical Exam: Blood pressure 123/70, pulse 65, temperature 97.9 F (36.6 C), temperature source Oral, resp. rate 20, height  (1.727 m), weight 150 lb  14.4 oz (68.448 kg), SpO2 93 %. Body mass index is 22.95 kg/(m^2). General: Well developed, well nourished, in no acute distress. Head eyes ears nose throat: Normocephalic, atraumatic, sclera non-icteric, no xanthomas, nares are without discharge. No apparent thyromegaly and/or mass  Lungs: Normal respiratory effort.  no wheezes, no rales, no rhonchi.  Heart: RRR with normal S1 S2. no murmur gallop, no rub, PMI is normal size and placement, carotid upstroke normal without bruit, jugular venous pressure is normal Abdomen: Soft, non-tender, non-distended with normoactive bowel sounds. No hepatomegaly. No rebound/guarding. No obvious abdominal masses. Abdominal aorta is normal size without bruit Extremities: No edema. no cyanosis, no clubbing, no ulcers  Peripheral : 2+ bilateral upper extremity pulses, 2+ bilateral femoral pulses, 2+ bilateral dorsal pedal pulse Neuro: Alert and oriented. No facial asymmetry. No focal  deficit. Moves all extremities spontaneously. Musculoskeletal: Normal muscle tone without kyphosis Psych:  Responds to questions appropriately with a normal affect.    Assessment: 59 year old male with known coronary artery disease status post coronary bypass graft essential hypertension peripheral vascular disease with cerebrovascular accident with a near syncopal episode multifactorial in nature possibly due to dehydration lower blood pressure with medication management side effects frequent preventricular contraction but currently no evidence of it on chronic systolic dysfunction heart failure and or myocardial infarction  Plan: 1. Continue telemetry with telemetry unit nursing 2. Begin ambulation following for the possibility of the chronotropic incompetence and causes for above and would consider the possibility of treadmill EKG to assess for chronotropic incompetence and/or other rhythm disturbances 3. Continue hydration for dehydration possibility and hypertension 4. Consider suspending ACE inhibitor due to concerns of hypotension causing above 5. Possible discharged home if patient feels much better with further outpatient evaluation as above  Signed, Lamar Blinks M.D. [facility] 05/08/2015, 1:04 PM

## 2015-05-08 NOTE — Progress Notes (Addendum)
MD rounding, RN updated Dr. Juliene Pina about +orthostatics and [phone]. MD gave verbal order to start 0.9% NS at 60ml/hr. RN also asked if MD wanted pt to get scheduled lisinopril this AM given orthostatics. MD said to hold at this time.

## 2015-05-08 NOTE — Progress Notes (Signed)
[facility] - [place] at [facility]   PATIENT NAME: Mario Diaz    MR#:  [id number]  DATE OF BIRTH:  06/27/1956  SUBJECTIVE:  Patient presents with dizziness and near syncopal episode. He was found to have significant orthostasis this morning  REVIEW OF SYSTEMS:    Review of Systems  Constitutional: Negative for fever, chills and malaise/fatigue.  HENT: Negative for sore throat.   Eyes: Negative for blurred vision.  Respiratory: Negative for cough, hemoptysis, shortness of breath and wheezing.   Cardiovascular: Negative for chest pain, palpitations and leg swelling.  Gastrointestinal: Negative for nausea, vomiting, abdominal pain, diarrhea and blood in stool.  Genitourinary: Negative for dysuria.  Musculoskeletal: Negative for back pain.  Neurological: Positive for dizziness. Negative for tremors and headaches.  Endo/Heme/Allergies: Does not bruise/bleed easily.    Tolerating Diet:yes      DRUG ALLERGIES:   Allergies  Allergen Reactions  . Sulfa Antibiotics Rash    VITALS:  Blood pressure 123/70, pulse 65, temperature 97.9 F (36.6 C), temperature source Oral, resp. rate 20, height  (1.727 m), weight 68.448 kg (150 lb 14.4 oz), SpO2 93 %.  PHYSICAL EXAMINATION:   Physical Exam  Constitutional: He is oriented to person, place, and time and well-developed, well-nourished, and in no distress. No distress.  HENT:  Head: Normocephalic.  Eyes: No scleral icterus.  Neck: Normal range of motion. Neck supple. No JVD present. No tracheal deviation present.  Cardiovascular: Normal rate, regular rhythm and normal heart sounds.  Exam reveals no gallop and no friction rub.   No murmur heard. Pulmonary/Chest: Effort normal and breath sounds normal. No respiratory distress. He has no wheezes. He has no rales. He exhibits no tenderness.  Abdominal: Soft. Bowel sounds are normal. He exhibits no distension and no mass. There is no tenderness. There is  no rebound and no guarding.  Musculoskeletal: Normal range of motion. He exhibits no edema.  Neurological: He is alert and oriented to person, place, and time.  Skin: Skin is warm. No rash noted. No erythema.  Psychiatric: Affect and judgment normal.      LABORATORY PANEL:   CBC  Recent Labs Lab 05/08/15 0844  WBC 8.4  HGB 15.6  HCT 46.3  PLT 200   ------------------------------------------------------------------------------------------------------------------  Chemistries   Recent Labs Lab 05/07/15 1822 05/08/15 0036 05/08/15 0844  NA 132*  --   --   K 3.8  --   --   CL 97*  --   --   CO2 27  --   --   GLUCOSE 97  --   --   BUN 8  --   --   CREATININE 0.88  --  0.72  CALCIUM 9.2  --   --   MG  --  2.0  --    ------------------------------------------------------------------------------------------------------------------  Cardiac Enzymes  Recent Labs Lab 05/07/15 1822 05/08/15 0036 05/08/15 0844  TROPONINI <0.03 <0.03 <0.03   ------------------------------------------------------------------------------------------------------------------  RADIOLOGY:  Dg Chest 2 View  05/08/2015   CLINICAL DATA:  New acute onset of dizziness and left-sided chest pain. Diaphoresis. Initial encounter.  EXAM: CHEST  2 VIEW  COMPARISON:  Chest radiograph performed 08/05/2010, and CT of the chest performed 03/23/2013  FINDINGS: The lungs are hyperexpanded, with flattening of the hemidiaphragms, compatible with COPD. Peribronchial thickening is noted. There is no evidence of focal opacification, pleural effusion or pneumothorax. Bilateral nipple shadows are seen.  The heart is normal in size. The patient is status post median  sternotomy, with prior removal of multiple sternal wires. No acute osseous abnormalities are seen. Cervical spinal fusion hardware is noted.  IMPRESSION: Findings of COPD.  No acute focal airspace consolidation seen.   Electronically Signed   By: Roanna Raider  M.D.   On: 05/08/2015 01:49   Ct Head Wo Contrast  05/08/2015   CLINICAL DATA:  Acute onset of dizziness.  Initial encounter.  EXAM: CT HEAD WITHOUT CONTRAST  TECHNIQUE: Contiguous axial images were obtained from the base of the skull through the vertex without intravenous contrast.  COMPARISON:  CT of the head performed 01/18/2013, and MRI of the brain performed 01/19/2013  FINDINGS: There is no evidence of acute infarction, mass lesion, or intra- or extra-axial hemorrhage on CT.  Prominence of the sulci suggests mild cortical volume loss. Mild periventricular white matter change likely reflects small vessel ischemic microangiopathy.  The brainstem and fourth ventricle are within normal limits. The basal ganglia are unremarkable in appearance. The cerebral hemispheres demonstrate grossly normal gray-white differentiation. No mass effect or midline shift is seen.  There is no evidence of fracture; visualized osseous structures are unremarkable in appearance. The visualized portions of the orbits are within normal limits. The paranasal sinuses and mastoid air cells are well-aerated. No significant soft tissue abnormalities are seen.  IMPRESSION: 1. No acute intracranial pathology seen on CT. 2. Mild cortical volume loss and mild small vessel ischemic microangiopathy.   Electronically Signed   By: Roanna Raider M.D.   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  1. Near syncope: Patient is significantly orthostatic. I will start low-dose fluids being careful due to his  Ischemic cardio myopathy. Echocardiogram and cardiac consultation is pending. Troponins 2 on within normal limits. Carotid Doppler was also ordered.T telemetry  2. ZOX:WRUEAVWU aspirin and lisinopril  3. Ischemic cardiomyopathy EF 20%: Echocardiogram is pending. Continue ACE inhibitor as tolerated.  4.  COPD: Patient does not appear to be in exacerbation at this time.    Management plans discussed with the patient and he is in agreement.  CODE STATUS: FULL  TOTAL TIME TAKING CARE OF THIS PATIENT: 30 minutes.     POSSIBLE D/C tomorrow, DEPENDING ON CLINICAL CONDITION.   Josely Moffat M.D on 05/08/2015 at 10:38 AM  Between 7am to 6pm - Pager - [phone] After 6pm go to [email or web address] - password [email or web address] [facility]  [facility]  Office  [phone]  CC: Primary care physician; [facility]  Note: This dictation was prepared with Dragon dictation along with smaller phrase technology. Any transcriptional errors that result from this process are unintentional.

## 2015-05-08 NOTE — ED Notes (Signed)
Pt to radiology.

## 2015-05-08 NOTE — H&P (Signed)
[facility] - [place] at [facility]   PATIENT NAME: Mario Diaz    MR#:  [id number]  DATE OF BIRTH:  17-Feb-1956  DATE OF ADMISSION:  05/08/2015  PRIMARY CARE PHYSICIAN: [facility]   REQUESTING/REFERRING PHYSICIAN: Zenda Alpers  CHIEF COMPLAINT:   Chief Complaint  Patient presents with  . Near Syncope    HISTORY OF PRESENT ILLNESS:  Mario Diaz  is a 59 y.o. male with a known history of hypertension, coronary artery disease status post CABG, CVA, ischemic cardiomyopathy with ejection fraction of 20%, COPD presents to the emergency room with the complaints of dizziness with near syncopal episode while driving yesterday with associated diaphoresis. He did have mild chest pain transiently which resolved on its own. Denies any shortness of breath, cough, fever, palpitations, loss of consciousness. No focal weakness or numbness. No nausea, vomiting, diarrhea, abdominal pain, dysuria. In the emergency room patient was evaluated by the ED physician and found to be with stable vital signs. Lab work revealed CBC BMP urinalysis and troponin 2 WNL. CT head negative for acute intracranial pathology. Chest x-ray findings of COPD otherwise negative for acute pathology. He was noted to have frequent PVCs with couplets on cardiac monitoring. Hospitalist service was consulted for further management. Patient at the current time is comfortably resting in the bed and denies any complaints.  PAST MEDICAL HISTORY:   Past Medical History  Diagnosis Date  . Hypertension   . Asthma   . COPD (chronic obstructive pulmonary disease) (HCC)   . Coronary artery disease   . Ischemic cardiomyopathy     PAST SURGICAL HISTORY:   Past Surgical History  Procedure Laterality Date  . Cardiac surgery      double bipass  . Coronary artery bypass graft      SOCIAL HISTORY:   Social History  Substance Use Topics  . Smoking status: Current Every Day Smoker    Types:  Cigarettes  . Smokeless tobacco: Never Used  . Alcohol Use: Yes    FAMILY HISTORY:   Family History  Problem Relation Age of Onset  . CAD Mother   . CAD Father   . Cancer Sister     DRUG ALLERGIES:   Allergies  Allergen Reactions  . Sulfa Antibiotics Rash    REVIEW OF SYSTEMS:   Review of Systems  Constitutional: Positive for diaphoresis. Negative for fever, chills and malaise/fatigue.  HENT: Negative for ear pain, hearing loss, nosebleeds, sore throat and tinnitus.   Eyes: Negative for blurred vision, double vision, pain, discharge and redness.  Respiratory: Negative for cough, hemoptysis, sputum production, shortness of breath and wheezing.   Cardiovascular: Negative for chest pain, palpitations, orthopnea and leg swelling.  Gastrointestinal: Negative for nausea, vomiting, abdominal pain, diarrhea, constipation, blood in stool and melena.  Genitourinary: Negative for dysuria, urgency, frequency and hematuria.  Musculoskeletal: Negative for back pain, joint pain and neck pain.  Skin: Negative for itching and rash.  Neurological: Positive for dizziness. Negative for tingling, sensory change, focal weakness and seizures.  Endo/Heme/Allergies: Does not bruise/bleed easily.  Psychiatric/Behavioral: Negative for depression. The patient is not nervous/anxious.     MEDICATIONS AT HOME:   Prior to Admission medications   Medication Sig Start Date End Date Taking? Authorizing Provider  acetaminophen (TYLENOL) 500 MG tablet Take 1 tablet by mouth as needed. 02/05/15  Yes Historical Provider, MD  aspirin 81 MG tablet Take 1 tablet by mouth daily. 02/05/15  Yes Historical Provider, MD  lisinopril (PRINIVIL,ZESTRIL) 10  MG tablet Take 1 tablet by mouth daily. 02/01/15  Yes Historical Provider, MD      VITAL SIGNS:  Blood pressure 126/77, pulse 69, temperature 97.8 F (36.6 C), temperature source Oral, resp. rate 13, height  (1.727 m), weight 74.844 kg (165 lb), SpO2 96  %.  PHYSICAL EXAMINATION:  Physical Exam  Constitutional: He is oriented to person, place, and time. He appears well-developed and well-nourished. No distress.  HENT:  Head: Normocephalic and atraumatic.  Right Ear: External ear normal.  Left Ear: External ear normal.  Nose: Nose normal.  Mouth/Throat: Oropharynx is clear and moist. No oropharyngeal exudate.  Eyes: EOM are normal. Pupils are equal, round, and reactive to light. No scleral icterus.  Neck: Normal range of motion. Neck supple. No JVD present. No thyromegaly present.  Cardiovascular: Normal rate, regular rhythm, normal heart sounds and intact distal pulses.  Exam reveals no friction rub.   No murmur heard. Respiratory: Effort normal and breath sounds normal. No respiratory distress. He has no wheezes. He has no rales. He exhibits no tenderness.  GI: Soft. Bowel sounds are normal. He exhibits no distension and no mass. There is no tenderness. There is no rebound and no guarding.  Musculoskeletal: Normal range of motion. He exhibits no edema.  Lymphadenopathy:    He has no cervical adenopathy.  Neurological: He is alert and oriented to person, place, and time. He has normal reflexes. He displays normal reflexes. No cranial nerve deficit. He exhibits normal muscle tone.  Skin: Skin is warm. No rash noted. No erythema.  Psychiatric: He has a normal mood and affect. His behavior is normal. Thought content normal.   LABORATORY PANEL:   CBC  Recent Labs Lab 05/07/15 1822  WBC 10.5  HGB 16.1  HCT 47.9  PLT 228   ------------------------------------------------------------------------------------------------------------------  Chemistries   Recent Labs Lab 05/07/15 1822 05/08/15 0036  NA 132*  --   K 3.8  --   CL 97*  --   CO2 27  --   GLUCOSE 97  --   BUN 8  --   CREATININE 0.88  --   CALCIUM 9.2  --   MG  --  2.0    ------------------------------------------------------------------------------------------------------------------  Cardiac Enzymes  Recent Labs Lab 05/08/15 0036  TROPONINI <0.03   ------------------------------------------------------------------------------------------------------------------  RADIOLOGY:  Dg Chest 2 View  05/08/2015   CLINICAL DATA:  New acute onset of dizziness and left-sided chest pain. Diaphoresis. Initial encounter.  EXAM: CHEST  2 VIEW  COMPARISON:  Chest radiograph performed 08/05/2010, and CT of the chest performed 03/23/2013  FINDINGS: The lungs are hyperexpanded, with flattening of the hemidiaphragms, compatible with COPD. Peribronchial thickening is noted. There is no evidence of focal opacification, pleural effusion or pneumothorax. Bilateral nipple shadows are seen.  The heart is normal in size. The patient is status post median sternotomy, with prior removal of multiple sternal wires. No acute osseous abnormalities are seen. Cervical spinal fusion hardware is noted.  IMPRESSION: Findings of COPD.  No acute focal airspace consolidation seen.   Electronically Signed   By: Roanna Raider M.D.   On: 05/08/2015 01:49   Ct Head Wo Contrast  05/08/2015   CLINICAL DATA:  Acute onset of dizziness.  Initial encounter.  EXAM: CT HEAD WITHOUT CONTRAST  TECHNIQUE: Contiguous axial images were obtained from the base of the skull through the vertex without intravenous contrast.  COMPARISON:  CT of the head performed 01/18/2013, and MRI of the brain performed 01/19/2013  FINDINGS:  There is no evidence of acute infarction, mass lesion, or intra- or extra-axial hemorrhage on CT.  Prominence of the sulci suggests mild cortical volume loss. Mild periventricular white matter change likely reflects small vessel ischemic microangiopathy.  The brainstem and fourth ventricle are within normal limits. The basal ganglia are unremarkable in appearance. The cerebral hemispheres demonstrate  grossly normal gray-white differentiation. No mass effect or midline shift is seen.  There is no evidence of fracture; visualized osseous structures are unremarkable in appearance. The visualized portions of the orbits are within normal limits. The paranasal sinuses and mastoid air cells are well-aerated. No significant soft tissue abnormalities are seen.  IMPRESSION: 1. No acute intracranial pathology seen on CT. 2. Mild cortical volume loss and mild small vessel ischemic microangiopathy.   Electronically Signed   By: Roanna Raider M.D.   On: 05/08/2015 01:54    EKG:   Orders placed or performed during the hospital encounter of 05/08/15  . EKG 12-Lead  . EKG 12-Lead  . ED EKG  . ED EKG  Normal sinus rhythm with ventricular 73 beats per minute, frequent PVCs, T-wave abnormality.  IMPRESSION AND PLAN:   1. Dizziness with near syncopal episode. Frequent PVCs with couplets on cardiac monitoring. Cardiogenic versus vasovagal. Rule out arrhythmia. Plan: Admit to telemetry for observation, cardiac monitoring. Cycle cardiac enzymes. Order echocardiogram and cardiology consultation for further evaluation and advice. 2. Coronary artery disease status post CABG. No acute problems. Troponin 2 WNL. Plan: Cycle cardiac enzymes, continue aspirin. No beta blocker because of history of bradycardia. Further workup for cardiac advice. 3. COPD, stable on home medications. Continue same. 4. Hypertension, stable on home medication. Continue same. 5. Ischemic cardiomyopathy with ejection fraction 20%. Stable clinically. Follow-up echo and cardiology advice.    All the records are reviewed and case discussed with ED provider. Management plans discussed with the patient and  in agreement.  CODE STATUS: Full code  TOTAL TIME TAKING CARE OF THIS PATIENT: 50 minutes.    Jonnie Kind N M.D on 05/08/2015 at 6:14 AM  Between 7am to 6pm - Pager - [phone]  After 6pm go to [email or web address] - password [email or web address]  [facility]  [facility] [facility]  Office  [phone]  CC: Primary care physician; [facility]

## 2015-05-08 NOTE — ED Notes (Signed)
SaO2 83-84% RA.  Dr Zenda Alpers notified, orders received to place on O2 2L [place].

## 2015-05-08 NOTE — Progress Notes (Signed)
Pt. admitted to unit, rm 235. Report taken from [facility] ED RN. Oriented to room, call bell, Ascom phones and staff. Bed in low position. Fall safety plan reviewed, contract signed and placed on wall, yellow non-skid socks in place, bed alarm on. Full assessment to Epic. Will continue to monitor.

## 2015-05-08 NOTE — Care Management Note (Signed)
Case Management Note  Patient Details  Name: Mario Diaz MRN: [id number] Date of Birth: 02-07-1956  Subjective/Objective:                 Patient presents with with syncope from home .  Patient lives at home alone.  Patient obtains his medication from [place].  Patient states that his children are local to him for support.  Patient does not have home O2.  Patient is currently on 1 liter of O2 acutely.  Patient states that he drives himself when needed.   RNCM to follow for discharge planning.     Action/Plan:   Expected Discharge Date:                  Expected Discharge Plan:     In-House Referral:     Discharge planning Services     Post Acute Care Choice:    Choice offered to:     DME Arranged:    DME Agency:     HH Arranged:    HH Agency:     Status of Service:     Medicare Important Message Given:  Yes-second notification given Date Medicare IM Given:    Medicare IM give by:    Date Additional Medicare IM Given:    Additional Medicare Important Message give by:     If discussed at Long Length of Stay Meetings, dates discussed:    Additional Comments:  Chapman Fitch, RN 05/08/2015, 3:01 PM

## 2015-05-08 NOTE — Progress Notes (Signed)
*  PRELIMINARY RESULTS* Echocardiogram 2D Echocardiogram has been performed.  Mario Diaz Hege 05/08/2015, 3:16 PM

## 2015-05-08 NOTE — ED Provider Notes (Signed)
[facility] Emergency Department Provider Note  ____________________________________________  Time seen: Approximately 0036 AM  I have reviewed the triage vital signs and the nursing notes.   HISTORY  Chief Complaint Near Syncope    HPI Mario Diaz is a 59 y.o. male who comes in today with an episode of lightheadedness and blurred vision. He reports that occurred approximate 5 PM. He reports currently he does feel improved. He reports he was driving when it occurred and he pulled over. He was possibly making a left turn but he reports that he sat until the symptoms felt better. He reports that hit him suddenly and he waited until the past. The patient parked his car and put the air conditioning on for a few minutes. He reports that he then went to the store for 30-40 minutes and got some water. The patient reports that he still felt weak so he decided to come in for evaluation. The patient denies pain but reports the short of breath. He denies any chest pain or abdominal pain. He's had some nausea with no vomiting. He reports that the blurred vision lasted 5 minutes. The patient reports he does have a history of low heart rate but has never had these symptoms before.   Past Medical History  Diagnosis Date  . Hypertension   . Asthma   . COPD (chronic obstructive pulmonary disease) (HCC)   . Coronary artery disease   . Ischemic cardiomyopathy     Patient Active Problem List   Diagnosis Date Noted  . Near syncope 05/08/2015  . CAD (coronary artery disease) 05/08/2015  . COPD (chronic obstructive pulmonary disease) (HCC) 05/08/2015  . HTN (hypertension) 05/08/2015  . Ischemic cardiomyopathy 05/08/2015    Past Surgical History  Procedure Laterality Date  . Cardiac surgery      double bipass  . Coronary artery bypass graft      Current Outpatient Rx  Name  Route  Sig  Dispense  Refill  . acetaminophen (TYLENOL) 500 MG tablet   Oral   Take 1 tablet  by mouth as needed.         Marland Kitchen aspirin 81 MG tablet   Oral   Take 1 tablet by mouth daily.         Marland Kitchen lisinopril (PRINIVIL,ZESTRIL) 10 MG tablet   Oral   Take 1 tablet by mouth daily.           Allergies Sulfa antibiotics  Family History  Problem Relation Age of Onset  . CAD Mother   . CAD Father   . Cancer Sister     Social History Social History  Substance Use Topics  . Smoking status: Current Every Day Smoker    Types: Cigarettes  . Smokeless tobacco: Never Used  . Alcohol Use: Yes    Review of Systems Constitutional: No fever/chills Eyes: Blurred vision ENT: No sore throat. Cardiovascular: Denies chest pain. Respiratory: Denies shortness of breath. Gastrointestinal: Nausea with No abdominal pain. , no vomiting.  No diarrhea.  No constipation. Genitourinary: Negative for dysuria. Musculoskeletal: Negative for back pain. Skin: Negative for rash. Neurological: Dizziness and lightheadedness.  10-point ROS otherwise negative.  ____________________________________________   PHYSICAL EXAM:  VITAL SIGNS: ED Triage Vitals  Enc Vitals Group     BP 05/07/15 1817 150/92 mmHg     Pulse Rate 05/07/15 1817 73     Resp 05/07/15 1817 18     Temp 05/07/15 1817 97.8 F (36.6 C)  Temp Source 05/07/15 1817 Oral     SpO2 05/07/15 1817 95 %     Weight 05/07/15 1817 165 lb (74.844 kg)     Height 05/07/15 1817  (1.727 m)     Head Cir --      Peak Flow --      Pain Score 05/07/15 1819 0     Pain Loc --      Pain Edu? --      Excl. in GC? --     Constitutional: Alert and oriented. Well appearing and in no acute distress. Eyes: Conjunctivae are normal. PERRL. EOMI. Head: Atraumatic. Nose: No congestion/rhinnorhea. Mouth/Throat: Mucous membranes are moist.  Oropharynx non-erythematous. Cardiovascular: Normal rate, regular rhythm. Grossly normal heart sounds.  Good peripheral circulation. Respiratory: Normal respiratory effort.  No retractions. Crackles  in left lung fields. Gastrointestinal: Soft and nontender. No distention. Positive bowel sounds Musculoskeletal: No lower extremity tenderness nor edema.   Neurologic:  Normal speech and language. Cranial nerves II through XII are grossly intact Skin:  Skin is warm, dry and intact.  Psychiatric: Mood and affect are normal.   ____________________________________________   LABS (all labs ordered are listed, but only abnormal results are displayed)  Labs Reviewed  BASIC METABOLIC PANEL - Abnormal; Notable for the following:    Sodium 132 (*)    Chloride 97 (*)    All other components within normal limits  URINALYSIS COMPLETEWITH MICROSCOPIC ([facility] ONLY) - Abnormal; Notable for the following:    Color, Urine YELLOW (*)    APPearance CLEAR (*)    Ketones, ur TRACE (*)    Squamous Epithelial / LPF 0-5 (*)    All other components within normal limits  CBC  TROPONIN I  TROPONIN I  MAGNESIUM  CBG MONITORING, ED   ____________________________________________  EKG  ED ECG REPORT I, Rebecka Apley, the attending physician, personally viewed and interpreted this ECG.   Date: 05/07/2015  EKG Time: 1815  Rate: 73  Rhythm: normal sinus rhythm, frequent PVC's noted  Axis: normal  Intervals:none  ST&T Change: flipped t waves in leads II, III, avf  ____________________________________________  RADIOLOGY  Chest x-ray: Findings of COPD, no acute focal airspace consolidation seen CT head: No acute intracranial pathology seen on CT, mild cortical volume loss and mild small vessel ischemic microangiopathy ____________________________________________   PROCEDURES  Procedure(s) performed: None  Critical Care performed: No  ____________________________________________   INITIAL IMPRESSION / ASSESSMENT AND PLAN / ED COURSE  Pertinent labs & imaging results that were available during my care of the patient were reviewed by me and considered in my medical decision making (see  chart for details).   This is a 59 year old male who comes in today with dizziness and blurry vision that occurred while he was driving today. The patient reports that it lasted a short amount of time and it did improve. The patient's blood work is unremarkable but he is having frequent PVCs as well as some couplets and triplets. Given the patient's cardiac history is concerning that he may be having these and that may be the cause of his symptoms. The patient has had a bypass in the past and he reports that although he has a carotid stent he has recently been taken off of his Plavix. I will admit the patient to the hospitalist service for further evaluation of these symptoms. The patient did have an episode where his sats decreased to 83% and he was put on 2 L of oxygen  by nasal cannula. He received a duoneb. I will admit the patient to the hospitalist service for evaluation ____________________________________________   FINAL CLINICAL IMPRESSION(S) / ED DIAGNOSES  Final diagnoses:  Dizziness  Hypoxia  Frequent PVCs      Rebecka Apley, MD 05/08/15 [phone]

## 2015-05-09 MED ORDER — GUAIFENESIN 100 MG/5ML PO SOLN
5.0000 mL | ORAL | Status: DC | PRN
  Administered 2015-05-09: 100 mg via ORAL
  Filled 2015-05-09: qty 10

## 2015-05-09 MED ORDER — ASPIRIN 81 MG PO TBEC: 81.0000 mg | DELAYED_RELEASE_TABLET | Freq: Every day | ORAL | Status: DC

## 2015-05-09 MED ORDER — IPRATROPIUM-ALBUTEROL 0.5-2.5 (3) MG/3ML IN SOLN: 3.0000 mL | Freq: Four times a day (QID) | RESPIRATORY_TRACT | Status: DC | PRN

## 2015-05-09 NOTE — Discharge Summary (Addendum)
[facility] - [place] at [facility]   PATIENT NAME: Mario Diaz    MR#:  [id number]  DATE OF BIRTH:  1955-12-01  DATE OF ADMISSION:  05/08/2015 ADMITTING PHYSICIAN: Crissie Figures, MD  DATE OF DISCHARGE: 05/09/2015  PRIMARY CARE PHYSICIAN: [facility]    ADMISSION DIAGNOSIS:  Dizziness [R42] Hypoxia [R09.02] Frequent PVCs [I49.3]  DISCHARGE DIAGNOSIS:  Principal Problem:   Near syncope Active Problems:   CAD (coronary artery disease)   COPD (chronic obstructive pulmonary disease) (HCC)   HTN (hypertension)   Ischemic cardiomyopathy   SECONDARY DIAGNOSIS:   Past Medical History  Diagnosis Date  . Hypertension   . Asthma   . COPD (chronic obstructive pulmonary disease) (HCC)   . Coronary artery disease   . Ischemic cardiomyopathy     HOSPITAL COURSE:  59 year old male with hypertension, CAD status post CABG and ischemic cardio myopathy EF 20% who presents with near syncope.  1. Near syncope: This is likely secondary to orthostasis. Patient was found to have significant orthostasis. His orthostatic hypotension resolved with IV fluids. Patient was seen and evaluated by cardiology. Patient had normal telemetry monitoring and normal troponins. Patient will follow up with his cardiologist Dr. Gwen Pounds next week for follow up on echocardiogram. 2. WUJ:WJXBJYNW aspirin and lisinopril  3. Ischemic cardiomyopathy EF 20%:  Continue ACE inhibitor. 4. COPD: Patient does not appear to be in exacerbation at this time. 5. Tobacco dependence: patient counseled to stop smoking for 3 minutes. He wants to try to quit on his own.  DISCHARGE CONDITIONS AND DIET:  Patient is being discharged home in stable condition on a heart healthy diet  CONSULTS OBTAINED:  Treatment Team:  Lamar Blinks, MD  DRUG ALLERGIES:   Allergies  Allergen Reactions  . Sulfa Antibiotics Rash    DISCHARGE MEDICATIONS:   Current Discharge Medication List     CONTINUE these medications which have NOT CHANGED   Details  acetaminophen (TYLENOL) 500 MG tablet Take 1 tablet by mouth as needed.    aspirin 81 MG tablet Take 1 tablet by mouth daily.    lisinopril (PRINIVIL,ZESTRIL) 10 MG tablet Take 1 tablet by mouth daily.              Today   CHIEF COMPLAINT:  Patient is doing well this morning. No acute events overnight.   VITAL SIGNS:  Blood pressure 114/62, pulse 67, temperature 98.1 F (36.7 C), temperature source Oral, resp. rate 18, height  (1.727 m), weight 68.765 kg (151 lb 9.6 oz), SpO2 91 %.   REVIEW OF SYSTEMS:  Review of Systems  Constitutional: Negative for fever, chills and malaise/fatigue.  HENT: Negative for sore throat.   Eyes: Negative for blurred vision.  Respiratory: Negative for cough, hemoptysis, shortness of breath and wheezing.   Cardiovascular: Negative for chest pain, palpitations and leg swelling.  Gastrointestinal: Negative for nausea, vomiting, abdominal pain, diarrhea and blood in stool.  Genitourinary: Negative for dysuria.  Musculoskeletal: Negative for back pain.  Neurological: Negative for dizziness, tremors and headaches.  Endo/Heme/Allergies: Does not bruise/bleed easily.     PHYSICAL EXAMINATION:  GENERAL:  59 y.o.-year-old patient lying in the bed with no acute distress.  NECK:  Supple, no jugular venous distention. No thyroid enlargement, no tenderness.  LUNGS: Normal breath sounds bilaterally, no wheezing, rales,rhonchi  No use of accessory muscles of respiration.  CARDIOVASCULAR: S1, S2 normal. No murmurs, rubs, or gallops.  ABDOMEN: Soft, non-tender, non-distended. Bowel sounds present. No  organomegaly or mass.  EXTREMITIES: No pedal edema, cyanosis, or clubbing.  PSYCHIATRIC: The patient is alert and oriented x 3.  SKIN: No obvious rash, lesion, or ulcer.   DATA REVIEW:   CBC  Recent Labs Lab 05/08/15 0844  WBC 8.4  HGB 15.6  HCT 46.3  PLT 200    Chemistries    Recent Labs Lab 05/07/15 1822 05/08/15 0036 05/08/15 0844  NA 132*  --   --   K 3.8  --   --   CL 97*  --   --   CO2 27  --   --   GLUCOSE 97  --   --   BUN 8  --   --   CREATININE 0.88  --  0.72  CALCIUM 9.2  --   --   MG  --  2.0  --     Cardiac Enzymes  Recent Labs Lab 05/08/15 0844 05/08/15 1453 05/08/15 2026  TROPONINI <0.03 <0.03 <0.03    Microbiology Results  @  RADIOLOGY:  Dg Chest 2 View  05/08/2015   CLINICAL DATA:  New acute onset of dizziness and left-sided chest pain. Diaphoresis. Initial encounter.  EXAM: CHEST  2 VIEW  COMPARISON:  Chest radiograph performed 08/05/2010, and CT of the chest performed 03/23/2013  FINDINGS: The lungs are hyperexpanded, with flattening of the hemidiaphragms, compatible with COPD. Peribronchial thickening is noted. There is no evidence of focal opacification, pleural effusion or pneumothorax. Bilateral nipple shadows are seen.  The heart is normal in size. The patient is status post median sternotomy, with prior removal of multiple sternal wires. No acute osseous abnormalities are seen. Cervical spinal fusion hardware is noted.  IMPRESSION: Findings of COPD.  No acute focal airspace consolidation seen.   Electronically Signed   By: Roanna Raider M.D.   On: 05/08/2015 01:49   Ct Head Wo Contrast  05/08/2015   CLINICAL DATA:  Acute onset of dizziness.  Initial encounter.  EXAM: CT HEAD WITHOUT CONTRAST  TECHNIQUE: Contiguous axial images were obtained from the base of the skull through the vertex without intravenous contrast.  COMPARISON:  CT of the head performed 01/18/2013, and MRI of the brain performed 01/19/2013  FINDINGS: There is no evidence of acute infarction, mass lesion, or intra- or extra-axial hemorrhage on CT.  Prominence of the sulci suggests mild cortical volume loss. Mild periventricular white matter change likely reflects small vessel ischemic microangiopathy.  The brainstem and fourth ventricle are within  normal limits. The basal ganglia are unremarkable in appearance. The cerebral hemispheres demonstrate grossly normal gray-white differentiation. No mass effect or midline shift is seen.  There is no evidence of fracture; visualized osseous structures are unremarkable in appearance. The visualized portions of the orbits are within normal limits. The paranasal sinuses and mastoid air cells are well-aerated. No significant soft tissue abnormalities are seen.  IMPRESSION: 1. No acute intracranial pathology seen on CT. 2. Mild cortical volume loss and mild small vessel ischemic microangiopathy.   Electronically Signed   By: Roanna Raider M.D.   On: 05/08/2015 01:54   US Carotid Bilateral  05/08/2015   CLINICAL DATA:  Near syncope and history of hypertension, TIA, coronary artery disease, tobacco use and left carotid stenting.  EXAM: BILATERAL CAROTID DUPLEX ULTRASOUND  TECHNIQUE: Wallace Cullens scale imaging, color Doppler and duplex ultrasound were performed of bilateral carotid and vertebral arteries in the neck.  COMPARISON:  Angiographic imaging room from prior carotid stent procedure on 04/07/2013 and CTA of the  neck on 01/19/2013.  FINDINGS: Criteria: Quantification of carotid stenosis is based on velocity parameters that correlate the residual internal carotid diameter with NASCET-based stenosis levels, using the diameter of the distal internal carotid lumen as the denominator for stenosis measurement.  The following velocity measurements were obtained:  RIGHT  ICA:  111/23 cm/sec  CCA:  112/30 cm/sec  SYSTOLIC ICA/CCA RATIO:  1.0  DIASTOLIC ICA/CCA RATIO:  0.8  ECA:  123 cm/sec  LEFT  ICA:  89/22 cm/sec  CCA:  90/22 cm/sec  SYSTOLIC ICA/CCA RATIO:  1.0  DIASTOLIC ICA/CCA RATIO:  1.0  ECA:  331 cm/sec  RIGHT CAROTID ARTERY: There is a moderate amount of partially calcified plaque in the common carotid artery. A mild amount of partially calcified plaque is present at the level of the distal bulb and proximal ICA.  Estimated right ICA stenosis is less than 50%.  RIGHT VERTEBRAL ARTERY: Antegrade flow with normal waveform and velocity.  LEFT CAROTID ARTERY: A stent is present extending from the carotid bulb into the cervical internal carotid artery. The stent lumen is widely patent with no plaque or stenosis identified. Velocities and waveforms are normal.  LEFT VERTEBRAL ARTERY: Antegrade flow with normal waveform and velocity.  IMPRESSION: 1. Moderate plaque in the right common carotid artery and mild plaque in the right carotid bulb and proximal ICA. Estimated right ICA stenosis is less than 50%. 2. Widely patent left carotid bulb and internal carotid artery after prior carotid stenting.   Electronically Signed   By: Irish Lack M.D.   On: 05/08/2015 16:43      Management plans discussed with the patient and he is in agreement. Stable for discharge home  Patient should follow up with Dr. Gwen Pounds in 1 week CODE STATUS:     Code Status Orders        Start     Ordered   05/08/15 0819  Full code   Continuous     05/08/15 0818      TOTAL TIME TAKING CARE OF THIS PATIENT: 35 minutes.    Melah Ebling M.D on 05/09/2015 at 11:17 AM  Between 7am to 6pm - Pager - [phone] After 6pm go to [email or web address] - password [email or web address] [facility]  [facility]  Office  [phone]  CC: Primary care physician; [facility]

## 2015-05-09 NOTE — Progress Notes (Signed)
Lyman Speller to be D/C'd Home per MD order.  Discussed with the patient and all questions fully answered.  VSS, Skin clean, dry and intact without evidence of skin break down, no evidence of skin tears noted. IV catheter discontinued intact. Site without signs and symptoms of complications. Dressing and pressure applied.  An After Visit Summary was printed and given to the patient.   D/c education completed with patient/family including follow up instructions, medication list, d/c activities limitations if indicated, with other d/c instructions as indicated by MD - patient able to verbalize understanding, all questions fully answered.   Patient instructed to return to ED, call 911, or call MD for any changes in condition.   Patient escorted via WC, and D/C home via private auto. No further needs from care management.   Rudean Haskell 05/09/2015 4:31 PM

## 2015-05-09 NOTE — Progress Notes (Signed)
Patient requesting cough medication. MD notified. Order obtained for Robitussin.

## 2015-05-09 NOTE — Progress Notes (Signed)
[facility] Cardiology [facility] Encounter Note  Patient: Mario Diaz / Admit Date: 05/08/2015 / Date of Encounter: 05/09/2015, 7:55 AM   Subjective: No evidence of syncope weakness or presyncope episodes Patient has not been ambulating No evidence of heart block  Review of Systems: Positive for: Weakness Negative for: Vision change, hearing change, syncope, dizziness, nausea, vomiting,diarrhea, bloody stool, stomach pain, cough, congestion, diaphoresis, urinary frequency, urinary pain,skin lesions, skin rashes Others previously listed  Objective: Telemetry: Normal sinus rhythm occasional ventricular bigeminy Physical Exam: Blood pressure 125/75, pulse 68, temperature 97.8 F (36.6 C), temperature source Oral, resp. rate 16, height  (1.727 m), weight 151 lb 9.6 oz (68.765 kg), SpO2 94 %. Body mass index is 23.06 kg/(m^2). General: Well developed, well nourished, in no acute distress. Head: Normocephalic, atraumatic, sclera non-icteric, no xanthomas, nares are without discharge. Neck: No apparent masses Lungs: Normal respirations with no wheezes, no rhonchi, no rales , no crackles   Heart: Regular rate and rhythm, normal S1 S2, no murmur, no rub, no gallop, PMI is normal size and placement, carotid upstroke normal without bruit, jugular venous pressure normal Abdomen: Soft, non-tender, non-distended with normoactive bowel sounds. No hepatosplenomegaly. Abdominal aorta is normal size without bruit Extremities: No edema, no clubbing, no cyanosis, no ulcers,  Peripheral: 2+ radial, 2+ femoral, 2+ dorsal pedal pulses Neuro: Alert and oriented. Moves all extremities spontaneously. Psych:  Responds to questions appropriately with a normal affect.   Intake/Output Summary (Last 24 hours) at 05/09/15 0755 Last data filed at 05/09/15 0700  Gross per 24 hour  Intake 1136.25 ml  Output   3350 ml  Net -2213.75 ml    Inpatient Medications:  . aspirin EC  81 mg Oral Daily  .  enoxaparin (LOVENOX) injection  40 mg Subcutaneous Q24H  . ipratropium-albuterol  3 mL Nebulization Q6H  . lisinopril  10 mg Oral Daily   Infusions:    Labs:  Recent Labs  05/07/15 1822 05/08/15 0036 05/08/15 0844  NA 132*  --   --   K 3.8  --   --   CL 97*  --   --   CO2 27  --   --   GLUCOSE 97  --   --   BUN 8  --   --   CREATININE 0.88  --  0.72  CALCIUM 9.2  --   --   MG  --  2.0  --    No results for input(s): AST, ALT, ALKPHOS, BILITOT, PROT, ALBUMIN in the last 72 hours.  Recent Labs  05/07/15 1822 05/08/15 0844  WBC 10.5 8.4  HGB 16.1 15.6  HCT 47.9 46.3  MCV 90.2 90.5  PLT 228 200    Recent Labs  05/08/15 0036 05/08/15 0844 05/08/15 1453 05/08/15 2026  TROPONINI <0.03 <0.03 <0.03 <0.03   Invalid input(s): POCBNP No results for input(s): HGBA1C in the last 72 hours.   Weights: Filed Weights   05/07/15 1817 05/08/15 0808 05/09/15 0457  Weight: 165 lb (74.844 kg) 150 lb 14.4 oz (68.448 kg) 151 lb 9.6 oz (68.765 kg)     Radiology/Studies:  Dg Chest 2 View  05/08/2015   CLINICAL DATA:  New acute onset of dizziness and left-sided chest pain. Diaphoresis. Initial encounter.  EXAM: CHEST  2 VIEW  COMPARISON:  Chest radiograph performed 08/05/2010, and CT of the chest performed 03/23/2013  FINDINGS: The lungs are hyperexpanded, with flattening of the hemidiaphragms, compatible with COPD. Peribronchial thickening is noted. There is no  evidence of focal opacification, pleural effusion or pneumothorax. Bilateral nipple shadows are seen.  The heart is normal in size. The patient is status post median sternotomy, with prior removal of multiple sternal wires. No acute osseous abnormalities are seen. Cervical spinal fusion hardware is noted.  IMPRESSION: Findings of COPD.  No acute focal airspace consolidation seen.   Electronically Signed   By: Roanna Raider M.D.   On: 05/08/2015 01:49   Ct Head Wo Contrast  05/08/2015   CLINICAL DATA:  Acute onset of dizziness.   Initial encounter.  EXAM: CT HEAD WITHOUT CONTRAST  TECHNIQUE: Contiguous axial images were obtained from the base of the skull through the vertex without intravenous contrast.  COMPARISON:  CT of the head performed 01/18/2013, and MRI of the brain performed 01/19/2013  FINDINGS: There is no evidence of acute infarction, mass lesion, or intra- or extra-axial hemorrhage on CT.  Prominence of the sulci suggests mild cortical volume loss. Mild periventricular white matter change likely reflects small vessel ischemic microangiopathy.  The brainstem and fourth ventricle are within normal limits. The basal ganglia are unremarkable in appearance. The cerebral hemispheres demonstrate grossly normal gray-white differentiation. No mass effect or midline shift is seen.  There is no evidence of fracture; visualized osseous structures are unremarkable in appearance. The visualized portions of the orbits are within normal limits. The paranasal sinuses and mastoid air cells are well-aerated. No significant soft tissue abnormalities are seen.  IMPRESSION: 1. No acute intracranial pathology seen on CT. 2. Mild cortical volume loss and mild small vessel ischemic microangiopathy.   Electronically Signed   By: Roanna Raider M.D.   On: 05/08/2015 01:54   US Carotid Bilateral  05/08/2015   CLINICAL DATA:  Near syncope and history of hypertension, TIA, coronary artery disease, tobacco use and left carotid stenting.  EXAM: BILATERAL CAROTID DUPLEX ULTRASOUND  TECHNIQUE: Wallace Cullens scale imaging, color Doppler and duplex ultrasound were performed of bilateral carotid and vertebral arteries in the neck.  COMPARISON:  Angiographic imaging room from prior carotid stent procedure on 04/07/2013 and CTA of the neck on 01/19/2013.  FINDINGS: Criteria: Quantification of carotid stenosis is based on velocity parameters that correlate the residual internal carotid diameter with NASCET-based stenosis levels, using the diameter of the distal internal  carotid lumen as the denominator for stenosis measurement.  The following velocity measurements were obtained:  RIGHT  ICA:  111/23 cm/sec  CCA:  112/30 cm/sec  SYSTOLIC ICA/CCA RATIO:  1.0  DIASTOLIC ICA/CCA RATIO:  0.8  ECA:  123 cm/sec  LEFT  ICA:  89/22 cm/sec  CCA:  90/22 cm/sec  SYSTOLIC ICA/CCA RATIO:  1.0  DIASTOLIC ICA/CCA RATIO:  1.0  ECA:  331 cm/sec  RIGHT CAROTID ARTERY: There is a moderate amount of partially calcified plaque in the common carotid artery. A mild amount of partially calcified plaque is present at the level of the distal bulb and proximal ICA. Estimated right ICA stenosis is less than 50%.  RIGHT VERTEBRAL ARTERY: Antegrade flow with normal waveform and velocity.  LEFT CAROTID ARTERY: A stent is present extending from the carotid bulb into the cervical internal carotid artery. The stent lumen is widely patent with no plaque or stenosis identified. Velocities and waveforms are normal.  LEFT VERTEBRAL ARTERY: Antegrade flow with normal waveform and velocity.  IMPRESSION: 1. Moderate plaque in the right common carotid artery and mild plaque in the right carotid bulb and proximal ICA. Estimated right ICA stenosis is less than 50%. 2. Widely patent  left carotid bulb and internal carotid artery after prior carotid stenting.   Electronically Signed   By: Irish Lack M.D.   On: 05/08/2015 16:43     Assessment and Recommendation  59 y.o. male with known chronic systolic dysfunction congestive heart failure with ejection fraction of 20% coronary artery disease status post coronary artery bypass graft essential hypertension with near syncopal episode and no current evidence of myocardial infarction with normal troponin and no evidence of telemetry changes concerning for need for pacemaker or causes of near syncope 1. Begin ambulation today following closely for any further rhythm disturbances likely causing above near syncope 2. Avoid beta blocker due to bradycardia and/or ventricular  bigeminy 3. Consider continuation of ACE inhibitor due to cardiomyopathy and LV systolic dysfunction unless patient has hypotension causing near syncope 4. No further cardiac diagnostics necessary at this time 5. Possible outpatient treadmill EKG to assess for chronotropic incompetence 6. Okay for discharge to home from cardiac standpoint with follow-up next week  Signed, Arnoldo Hooker M.D. FACC

## 2015-05-09 NOTE — Care Management Note (Signed)
Case Management Note  Patient Details  Name: Mario Diaz MRN: [id number] Date of Birth: 06-May-1956  Subjective/Objective:                  Met with patient to discuss discharge planning. He lives alone and drives short distances. He has never used home health services. He has 3 children that he states works during the day. He has no DME. His PCP is with [facility] which assist him with Rx. He denies difficulty obtaining Rx. No needs per patient.   Action/Plan:   List of home health agencies left with patient.   Expected Discharge Date:                  Expected Discharge Plan:     In-House Referral:     Discharge planning Services  CM Consult  Post Acute Care Choice:    Choice offered to:  Patient  DME Arranged:    DME Agency:     HH Arranged:    [place] Agency:     Status of Service:  Completed, signed off  Medicare Important Message Given:  Yes-second notification given Date Medicare IM Given:    Medicare IM give by:    Date Additional Medicare IM Given:    Additional Medicare Important Message give by:     If discussed at [place] of Stay Meetings, dates discussed:    Additional Comments:  Marshell Garfinkel, RN 05/09/2015, 3:12 PM

## 2015-05-09 NOTE — Progress Notes (Signed)
Ambulated patient in the hallway. Patient still complained of some blurred vision and dizziness while sitting in the room. Notified Dr. Juliene Pina about patient's concerns with discharging due to low blood pressures. Dr. Juliene Pina spoke with the patient, Per Dr. Juliene Pina okay to discharge patient.   Filed Vitals:   05/09/15 1458  BP: 100/56  Pulse: 62  Temp:   Resp:

## 2015-05-09 NOTE — Progress Notes (Addendum)
Assess patient's vitals prior to ambulating and after    Filed Vitals:   05/09/15 1205  BP: 110/77  Pulse: 77  Temp:   Resp:    Patient tolerated ambulation. Patient is complaining of dizziness, weakness and nausea.  Per Dr. Gwen Pounds continue to monitor the patient's vitals.

## 2015-05-29 ENCOUNTER — Emergency Department: Payer: Medicare HMO

## 2015-05-29 ENCOUNTER — Emergency Department
Admission: EM | Admit: 2015-05-29 | Discharge: 2015-05-29 | Disposition: A | Discharge status: Home / Self Care | Payer: Medicare HMO | Attending: Emergency Medicine | Admitting: Emergency Medicine

## 2015-05-29 DIAGNOSIS — Z79899 Other long term (current) drug therapy: Secondary | ICD-10-CM | POA: Diagnosis not present

## 2015-05-29 DIAGNOSIS — I10 Essential (primary) hypertension: Secondary | ICD-10-CM | POA: Insufficient documentation

## 2015-05-29 DIAGNOSIS — Z720 Tobacco use: Secondary | ICD-10-CM | POA: Diagnosis not present

## 2015-05-29 DIAGNOSIS — J441 Chronic obstructive pulmonary disease with (acute) exacerbation: Secondary | ICD-10-CM | POA: Diagnosis not present

## 2015-05-29 DIAGNOSIS — Z7982 Long term (current) use of aspirin: Secondary | ICD-10-CM | POA: Diagnosis not present

## 2015-05-29 DIAGNOSIS — J209 Acute bronchitis, unspecified: Secondary | ICD-10-CM

## 2015-05-29 DIAGNOSIS — J440 Chronic obstructive pulmonary disease with acute lower respiratory infection: Secondary | ICD-10-CM

## 2015-05-29 DIAGNOSIS — R05 Cough: Secondary | ICD-10-CM | POA: Diagnosis present

## 2015-05-29 MED ORDER — PREDNISONE 20 MG PO TABS
60.0000 mg | ORAL_TABLET | Freq: Once | ORAL | Status: AC
  Administered 2015-05-29: 60 mg via ORAL
  Filled 2015-05-29: qty 3

## 2015-05-29 MED ORDER — AZITHROMYCIN 250 MG PO TABS
500.0000 mg | ORAL_TABLET | Freq: Once | ORAL | Status: AC
  Administered 2015-05-29: 500 mg via ORAL
  Filled 2015-05-29: qty 2

## 2015-05-29 MED ORDER — IPRATROPIUM-ALBUTEROL 0.5-2.5 (3) MG/3ML IN SOLN
3.0000 mL | Freq: Once | RESPIRATORY_TRACT | Status: AC
  Administered 2015-05-29: 3 mL via RESPIRATORY_TRACT
  Filled 2015-05-29: qty 3

## 2015-05-29 MED ORDER — PREDNISONE 20 MG PO TABS: 60.0000 mg | ORAL_TABLET | Freq: Every day | ORAL | Status: AC

## 2015-05-29 MED ORDER — AZITHROMYCIN 250 MG PO TABS: 500.0000 mg | ORAL_TABLET | Freq: Every day | ORAL | Status: AC

## 2015-05-29 MED ORDER — ALBUTEROL SULFATE HFA 108 (90 BASE) MCG/ACT IN AERS
2.0000 | INHALATION_SPRAY | Freq: Four times a day (QID) | RESPIRATORY_TRACT | Status: AC | PRN
Start: 2015-05-29 — End: ?

## 2015-05-29 NOTE — Discharge Instructions (Signed)
Chronic Obstructive Pulmonary Disease Chronic obstructive pulmonary disease (COPD) is a common lung condition in which airflow from the lungs is limited. COPD is a general term that can be used to describe many different lung problems that limit airflow, including both chronic bronchitis and emphysema. If you have COPD, your lung function will probably never return to normal, but there are measures you can take to improve lung function and make yourself feel better. CAUSES   Smoking (common).  Exposure to secondhand smoke.  Genetic problems.  Chronic inflammatory lung diseases or recurrent infections. SYMPTOMS  Shortness of breath, especially with physical activity.  Deep, persistent (chronic) cough with a large amount of thick mucus.  Wheezing.  Rapid breaths (tachypnea).  Gray or bluish discoloration (cyanosis) of the skin, especially in your fingers, toes, or lips.  Fatigue.  Weight loss.  Frequent infections or episodes when breathing symptoms become much worse (exacerbations).  Chest tightness. DIAGNOSIS Your health care provider will take a medical history and perform a physical examination to diagnose COPD. Additional tests for COPD may include:  Lung (pulmonary) function tests.  Chest X-ray.  CT scan.  Blood tests. TREATMENT  Treatment for COPD may include:  Inhaler and nebulizer medicines. These help manage the symptoms of COPD and make your breathing more comfortable.  Supplemental oxygen. Supplemental oxygen is only helpful if you have a low oxygen level in your blood.  Exercise and physical activity. These are beneficial for nearly all people with COPD.  Lung surgery or transplant.  Nutrition therapy to gain weight, if you are underweight.  Pulmonary rehabilitation. This may involve working with a team of health care providers and specialists, such as respiratory, occupational, and physical therapists. HOME CARE INSTRUCTIONS  Take all medicines  (inhaled or pills) as directed by your health care provider.  Avoid over-the-counter medicines or cough syrups that dry up your airway (such as antihistamines) and slow down the elimination of secretions unless instructed otherwise by your health care provider.  If you are a smoker, the most important thing that you can do is stop smoking. Continuing to smoke will cause further lung damage and breathing trouble. Ask your health care provider for help with quitting smoking. He or she can direct you to community resources or hospitals that provide support.  Avoid exposure to irritants such as smoke, chemicals, and fumes that aggravate your breathing.  Use oxygen therapy and pulmonary rehabilitation if directed by your health care provider. If you require home oxygen therapy, ask your health care provider whether you should purchase a pulse oximeter to measure your oxygen level at home.  Avoid contact with individuals who have a contagious illness.  Avoid extreme temperature and humidity changes.  Eat healthy foods. Eating smaller, more frequent meals and resting before meals may help you maintain your strength.  Stay active, but balance activity with periods of rest. Exercise and physical activity will help you maintain your ability to do things you want to do.  Preventing infection and hospitalization is very important when you have COPD. Make sure to receive all the vaccines your health care provider recommends, especially the pneumococcal and influenza vaccines. Ask your health care provider whether you need a pneumonia vaccine.  Learn and use relaxation techniques to manage stress.  Learn and use controlled breathing techniques as directed by your health care provider. Controlled breathing techniques include:  Pursed lip breathing. Start by breathing in (inhaling) through your nose for 1 second. Then, purse your lips as if you were   going to whistle and breathe out (exhale) through the  pursed lips for 2 seconds.  Diaphragmatic breathing. Start by putting one hand on your abdomen just above your waist. Inhale slowly through your nose. The hand on your abdomen should move out. Then purse your lips and exhale slowly. You should be able to feel the hand on your abdomen moving in as you exhale.  Learn and use controlled coughing to clear mucus from your lungs. Controlled coughing is a series of short, progressive coughs. The steps of controlled coughing are: 1. Lean your head slightly forward. 2. Breathe in deeply using diaphragmatic breathing. 3. Try to hold your breath for 3 seconds. 4. Keep your mouth slightly open while coughing twice. 5. Spit any mucus out into a tissue. 6. Rest and repeat the steps once or twice as needed. SEEK MEDICAL CARE IF:  You are coughing up more mucus than usual.  There is a change in the color or thickness of your mucus.  Your breathing is more labored than usual.  Your breathing is faster than usual. SEEK IMMEDIATE MEDICAL CARE IF:  You have shortness of breath while you are resting.  You have shortness of breath that prevents you from:  Being able to talk.  Performing your usual physical activities.  You have chest pain lasting longer than 5 minutes.  Your skin color is more cyanotic than usual.  You measure low oxygen saturations for longer than 5 minutes with a pulse oximeter. MAKE SURE YOU:  Understand these instructions.  Will watch your condition.  Will get help right away if you are not doing well or get worse.   This information is not intended to replace advice given to you by your health care provider. Make sure you discuss any questions you have with your health care provider.   Document Released: 04/30/2005 Document Revised: 08/11/2014 Document Reviewed: 03/17/2013 Elsevier Interactive Patient Education 2016 Elsevier Inc.  

## 2015-05-29 NOTE — ED Provider Notes (Signed)
[facility] Emergency Department Provider Note  ____________________________________________  Time seen: 3:00 AM  I have reviewed the triage vital signs and the nursing notes.   HISTORY  Chief Complaint Cough    HPI Mario Diaz is a 59 y.o. male presents with productive cough and dyspnea times 3 weeks worsened tonight. Patient states he has a history of COPD and ran out of his inhaler couple months ago. Patient denies any fever. Patient admits to continued tobacco use although decreased in amount per day.    Past Medical History  Diagnosis Date  . Hypertension   . Asthma   . COPD (chronic obstructive pulmonary disease) (HCC)   . Coronary artery disease   . Ischemic cardiomyopathy     Patient Active Problem List   Diagnosis Date Noted  . Near syncope 05/08/2015  . CAD (coronary artery disease) 05/08/2015  . COPD (chronic obstructive pulmonary disease) (HCC) 05/08/2015  . HTN (hypertension) 05/08/2015  . Ischemic cardiomyopathy 05/08/2015    Past Surgical History  Procedure Laterality Date  . Cardiac surgery      double bipass  . Coronary artery bypass graft      Current Outpatient Rx  Name  Route  Sig  Dispense  Refill  . acetaminophen (TYLENOL) 500 MG tablet   Oral   Take 1 tablet by mouth as needed.         Marland Kitchen. aspirin 81 MG tablet   Oral   Take 1 tablet by mouth daily.         Marland Kitchen. lisinopril (PRINIVIL,ZESTRIL) 10 MG tablet   Oral   Take 1 tablet by mouth daily.           Allergies Sulfa antibiotics  Family History  Problem Relation Age of Onset  . CAD Mother   . CAD Father   . Cancer Sister     Social History Social History  Substance Use Topics  . Smoking status: Current Every Day Smoker    Types: Cigarettes  . Smokeless tobacco: Never Used  . Alcohol Use: Yes    Review of Systems  Constitutional: Negative for fever. Eyes: Negative for visual changes. ENT: Negative for sore throat. Cardiovascular:  Negative for chest pain. Respiratory: Positive cough and dyspnea Gastrointestinal: Negative for abdominal pain, vomiting and diarrhea. Genitourinary: Negative for dysuria. Musculoskeletal: Negative for back pain. Skin: Negative for rash. Neurological: Negative for headaches, focal weakness or numbness.  10-point ROS otherwise negative.  ____________________________________________   PHYSICAL EXAM:  VITAL SIGNS: ED Triage Vitals  Enc Vitals Group     BP 05/29/15 0202 140/60 mmHg     Pulse Rate 05/29/15 0202 68     Resp 05/29/15 0202 18     Temp 05/29/15 0202 97.5 F (36.4 C)     Temp Source 05/29/15 0202 Oral     SpO2 05/29/15 0202 93 %     Weight 05/29/15 0202 150 lb (68.04 kg)     Height 05/29/15 0202 5\' 8"  (1.727 m)     Head Cir --      Peak Flow --      Pain Score --      Pain Loc --      Pain Edu? --      Excl. in GC? --      Constitutional: Alert and oriented. Well appearing and in no distress. Eyes: Conjunctivae are normal. PERRL. Normal extraocular movements. ENT   Head: Normocephalic and atraumatic.   Nose: No congestion/rhinnorhea.   Mouth/Throat:  Mucous membranes are moist.   Neck: No stridor. Hematological/Lymphatic/Immunilogical: No cervical lymphadenopathy. Cardiovascular: Normal rate, regular rhythm. Normal and symmetric distal pulses are present in all extremities. No murmurs, rubs, or gallops. Respiratory: Normal respiratory effort without tachypnea nor retractions. Breath sounds are clear and equal bilaterally. Mild expiratory wheezes Gastrointestinal: Soft and nontender. No distention. There is no CVA tenderness. Genitourinary: deferred Musculoskeletal: Nontender with normal range of motion in all extremities. No joint effusions.  No lower extremity tenderness nor edema. Neurologic:  Normal speech and language. No gross focal neurologic deficits are appreciated. Speech is normal.  Skin:  Skin is warm, dry and intact. No rash  noted. Psychiatric: Mood and affect are normal. Speech and behavior are normal. Patient exhibits appropriate insight and judgment.    RADIOLOGY        DG Chest Portable 1 View (Final result) Result time: 05/29/15 03:26:48   Final result by Rad Results In Interface (05/29/15 03:26:48)   Narrative:   CLINICAL DATA: Subacute onset of productive cough. Initial encounter.  EXAM: PORTABLE CHEST 1 VIEW  COMPARISON: Chest radiograph performed 05/08/2015  FINDINGS: The lungs are well-aerated. Mild bibasilar opacities likely reflect atelectasis. Underlying emphysematous change is noted at the upper lung lobes. There is no evidence of pleural effusion or pneumothorax. Bilateral nipple shadows are noted.  The cardiomediastinal silhouette is normal in size. Postoperative change is noted about the mediastinum. No acute osseous abnormalities are seen.  IMPRESSION: Mild bibasilar opacities likely reflect atelectasis. Underlying findings of COPD.   Electronically Signed By: Roanna Raider M.D. On: 05/29/2015 03:26         INITIAL IMPRESSION / ASSESSMENT AND PLAN / ED COURSE  Pertinent labs & imaging results that were available during my care of the patient were reviewed by me and considered in my medical decision making (see chart for details).  History of physical exam consistent with acute on chronic bronchitis. Patient received albuterol treatment in the emergency department with resolution of wheezing will be prescribed an inhaler and prednisone and azithromycin for home.  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

## 2015-05-29 NOTE — ED Notes (Addendum)
.  pt ambulatory to triage without difficulty or distress noted; c/o productive cough green sputum x 3wks; st hx COPD, st has appt at 1pm with PCP but feels as if his inhaler not helping his breathing tonight; denies fever, denies pain

## 2015-05-29 NOTE — ED Notes (Signed)
Patient uses rescue inhaler but it has run out. Has been out of medication for COPD for a couple of months. States he just felt like he was not getting his breath in tonight.

## 2016-11-17 IMAGING — CT CT HEAD W/O CM
1 series · 15 of 30 positions shown, 19 images · non-contrast
Comparison: CT of the head performed 01/18/2013, and MRI of the
brain performed 01/19/2013

CLINICAL DATA: Acute onset of dizziness.  Initial encounter.

EXAM:
CT HEAD WITHOUT CONTRAST
TECHNIQUE: Contiguous axial images were obtained from the base of the skull
through the vertex without intravenous contrast.

[Series 2: head wo · axial · 0.47mm/px · z∈[-152,-16]mm · 15 of 30 slices shown, 19 images]
[im 2/30  brain]
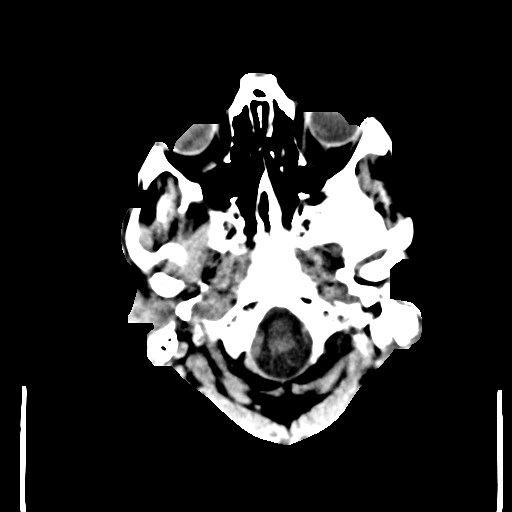
[im 2/30  bone]
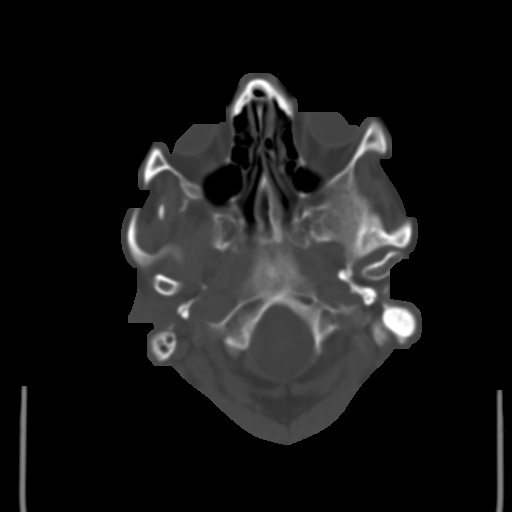
[im 4/30  brain]
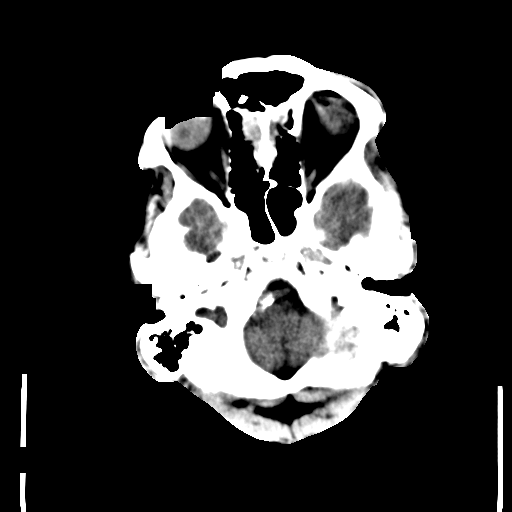
[im 6/30  brain]
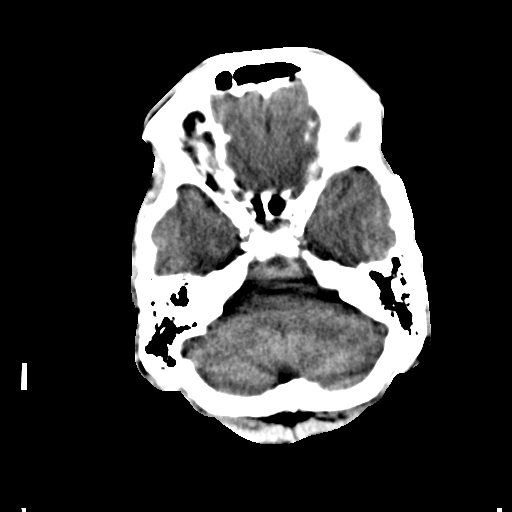
[im 8/30  brain]
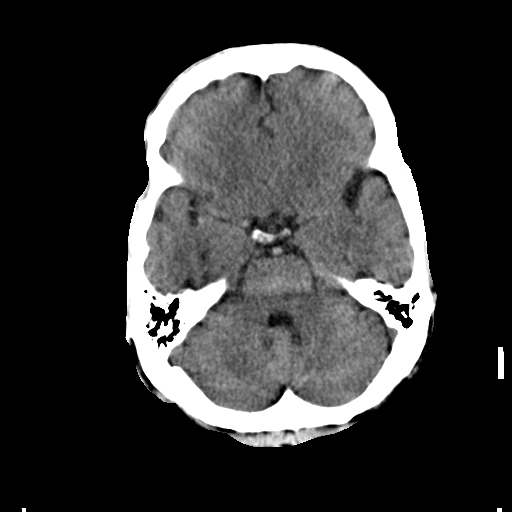
[im 10/30  brain]
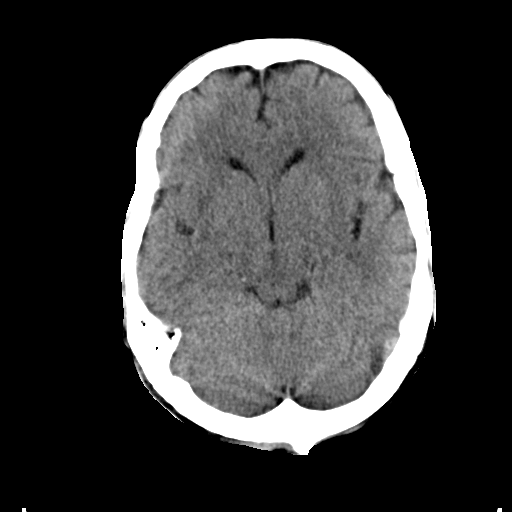
[im 10/30  bone]
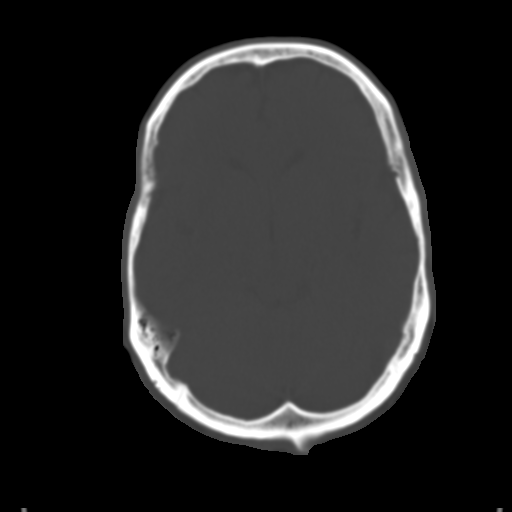
[im 12/30  brain]
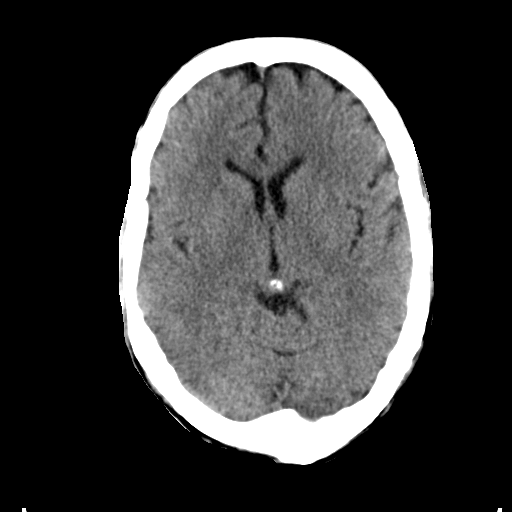
[im 14/30  brain]
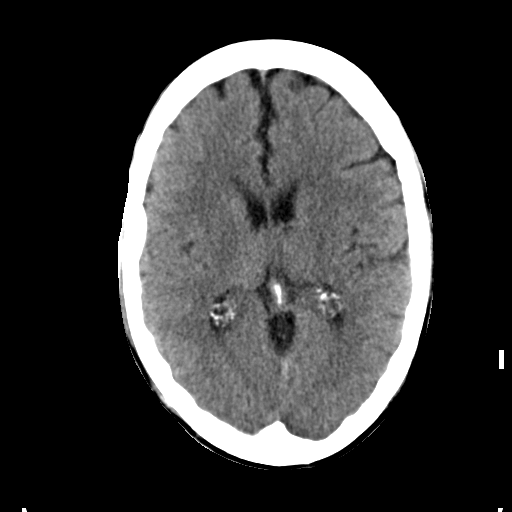
[im 16/30  brain]
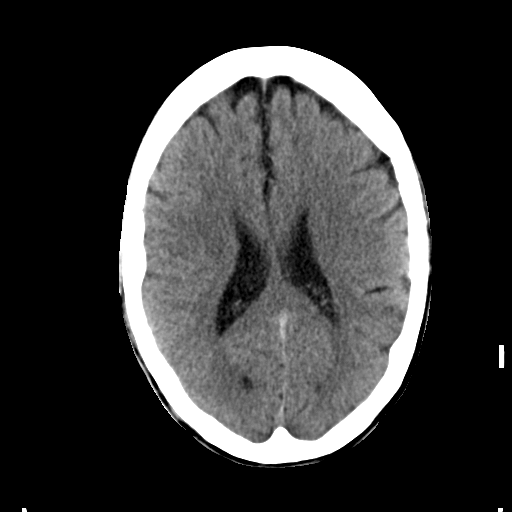
[im 17/30  brain]
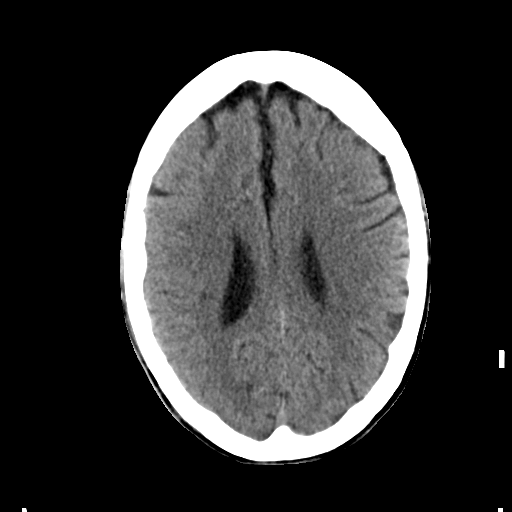
[im 17/30  bone]
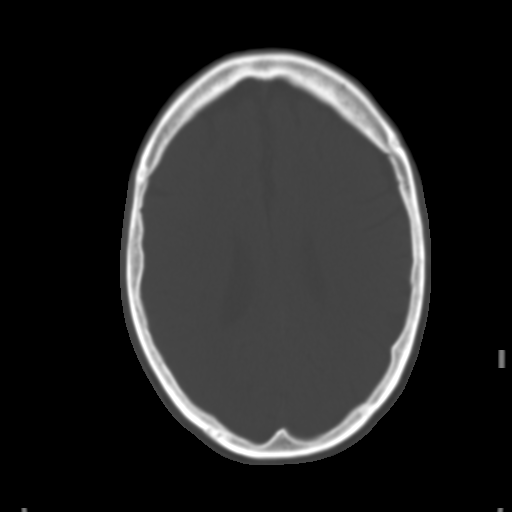
[im 19/30  brain]
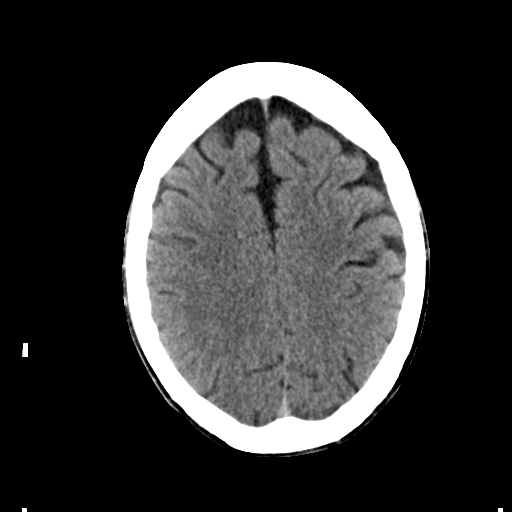
[im 21/30  brain]
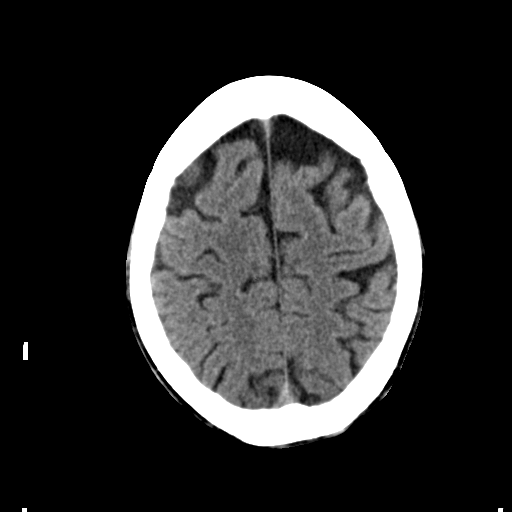
[im 23/30  brain]
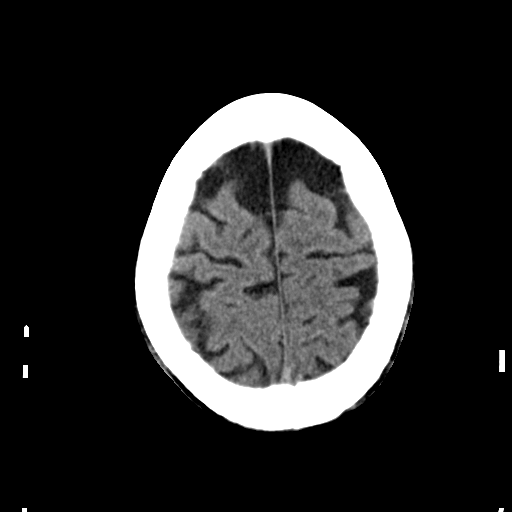
[im 25/30  brain]
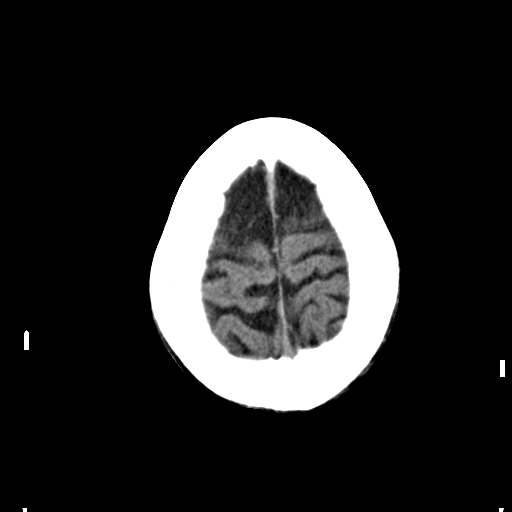
[im 25/30  bone]
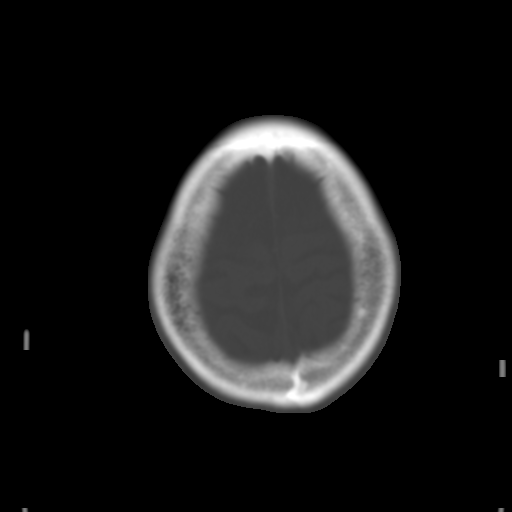
[im 27/30  brain]
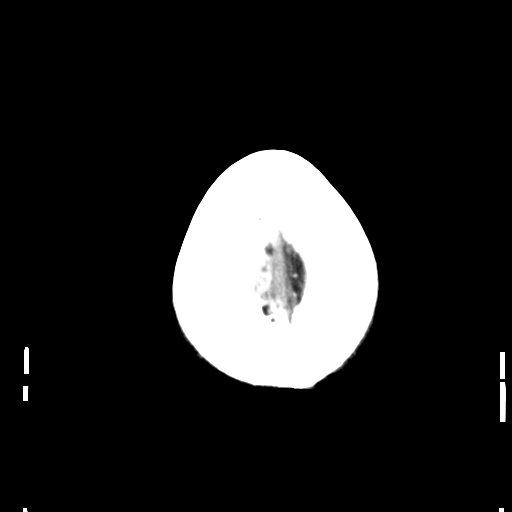
[im 29/30  brain]
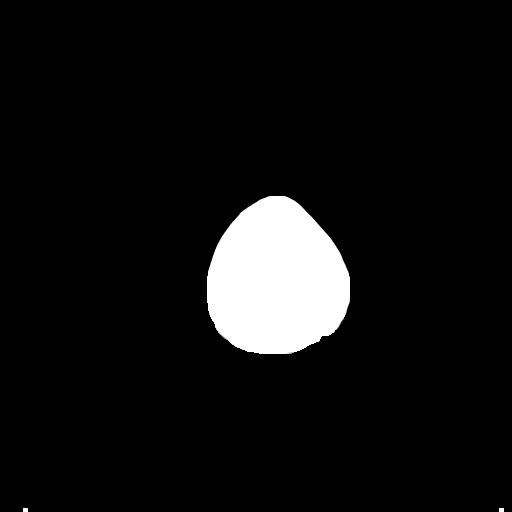

[15 of 30 positions shown; findings below may reference images not displayed]

FINDINGS: There is no evidence of acute infarction, mass lesion, or intra- or
extra-axial hemorrhage on CT.

Prominence of the sulci suggests mild cortical volume loss. Mild
periventricular white matter change likely reflects small vessel
ischemic microangiopathy.

The brainstem and fourth ventricle are within normal limits. The
basal ganglia are unremarkable in appearance. The cerebral
hemispheres demonstrate grossly normal gray-white differentiation.
No mass effect or midline shift is seen.

There is no evidence of fracture; visualized osseous structures are
unremarkable in appearance. The visualized portions of the orbits
are within normal limits. The paranasal sinuses and mastoid air
cells are well-aerated. No significant soft tissue abnormalities are
seen.
IMPRESSION: 1. No acute intracranial pathology seen on CT.
2. Mild cortical volume loss and mild small vessel ischemic
microangiopathy.

## 2018-05-21 ENCOUNTER — Emergency Department: Payer: Medicare HMO

## 2018-05-21 ENCOUNTER — Inpatient Hospital Stay: Payer: Medicare HMO

## 2018-05-21 ENCOUNTER — Encounter: Payer: Self-pay | Admitting: Emergency Medicine

## 2018-05-21 ENCOUNTER — Other Ambulatory Visit: Payer: Self-pay

## 2018-05-21 DIAGNOSIS — I472 Ventricular tachycardia: Secondary | ICD-10-CM | POA: Diagnosis not present

## 2018-05-21 DIAGNOSIS — Z951 Presence of aortocoronary bypass graft: Secondary | ICD-10-CM

## 2018-05-21 DIAGNOSIS — I255 Ischemic cardiomyopathy: Secondary | ICD-10-CM | POA: Diagnosis present

## 2018-05-21 DIAGNOSIS — E782 Mixed hyperlipidemia: Secondary | ICD-10-CM | POA: Diagnosis present

## 2018-05-21 DIAGNOSIS — I10 Essential (primary) hypertension: Secondary | ICD-10-CM | POA: Diagnosis present

## 2018-05-21 DIAGNOSIS — F17210 Nicotine dependence, cigarettes, uncomplicated: Secondary | ICD-10-CM | POA: Diagnosis present

## 2018-05-21 DIAGNOSIS — R0602 Shortness of breath: Secondary | ICD-10-CM | POA: Diagnosis present

## 2018-05-21 DIAGNOSIS — J9601 Acute respiratory failure with hypoxia: Secondary | ICD-10-CM | POA: Diagnosis not present

## 2018-05-21 DIAGNOSIS — R918 Other nonspecific abnormal finding of lung field: Secondary | ICD-10-CM | POA: Diagnosis present

## 2018-05-21 DIAGNOSIS — I4891 Unspecified atrial fibrillation: Secondary | ICD-10-CM | POA: Diagnosis present

## 2018-05-21 DIAGNOSIS — I4901 Ventricular fibrillation: Secondary | ICD-10-CM | POA: Diagnosis not present

## 2018-05-21 DIAGNOSIS — R7401 Elevation of levels of liver transaminase levels: Secondary | ICD-10-CM

## 2018-05-21 DIAGNOSIS — I462 Cardiac arrest due to underlying cardiac condition: Secondary | ICD-10-CM | POA: Diagnosis not present

## 2018-05-21 DIAGNOSIS — Z7982 Long term (current) use of aspirin: Secondary | ICD-10-CM

## 2018-05-21 DIAGNOSIS — R740 Nonspecific elevation of levels of transaminase and lactic acid dehydrogenase [LDH]: Secondary | ICD-10-CM

## 2018-05-21 DIAGNOSIS — E874 Mixed disorder of acid-base balance: Secondary | ICD-10-CM | POA: Diagnosis not present

## 2018-05-21 DIAGNOSIS — J441 Chronic obstructive pulmonary disease with (acute) exacerbation: Secondary | ICD-10-CM | POA: Diagnosis present

## 2018-05-21 DIAGNOSIS — I959 Hypotension, unspecified: Secondary | ICD-10-CM | POA: Diagnosis not present

## 2018-05-21 DIAGNOSIS — I2510 Atherosclerotic heart disease of native coronary artery without angina pectoris: Secondary | ICD-10-CM | POA: Diagnosis present

## 2018-05-21 DIAGNOSIS — I2119 ST elevation (STEMI) myocardial infarction involving other coronary artery of inferior wall: Secondary | ICD-10-CM | POA: Diagnosis not present

## 2018-05-21 DIAGNOSIS — I4892 Unspecified atrial flutter: Secondary | ICD-10-CM | POA: Diagnosis present

## 2018-05-21 DIAGNOSIS — Z8249 Family history of ischemic heart disease and other diseases of the circulatory system: Secondary | ICD-10-CM | POA: Diagnosis not present

## 2018-05-21 DIAGNOSIS — I469 Cardiac arrest, cause unspecified: Secondary | ICD-10-CM | POA: Diagnosis not present

## 2018-05-21 LAB — URINALYSIS, COMPLETE (UACMP) WITH MICROSCOPIC
BACTERIA UA: NONE SEEN
BILIRUBIN URINE: NEGATIVE
Glucose, UA: NEGATIVE mg/dL
HGB URINE DIPSTICK: NEGATIVE
KETONES UR: NEGATIVE mg/dL
LEUKOCYTES UA: NEGATIVE
NITRITE: NEGATIVE
PROTEIN: NEGATIVE mg/dL
SQUAMOUS EPITHELIAL / LPF: NONE SEEN (ref 0–5)
Specific Gravity, Urine: 1.002 — ABNORMAL LOW (ref 1.005–1.030)
pH: 6 (ref 5.0–8.0)

## 2018-05-21 LAB — CBC
HEMATOCRIT: 47.6 % (ref 39.0–52.0)
HEMOGLOBIN: 15.5 g/dL (ref 13.0–17.0)
MCH: 31.6 pg (ref 26.0–34.0)
MCHC: 32.6 g/dL (ref 30.0–36.0)
MCV: 96.9 fL (ref 80.0–100.0)
NRBC: 0 % (ref 0.0–0.2)
Platelets: 211 10*3/uL (ref 150–400)
RBC: 4.91 MIL/uL (ref 4.22–5.81)
RDW: 13.9 % (ref 11.5–15.5)
WBC: 8.8 10*3/uL (ref 4.0–10.5)

## 2018-05-21 LAB — COMPREHENSIVE METABOLIC PANEL
ALT: 82 U/L — AB (ref 0–44)
AST: 57 U/L — AB (ref 15–41)
Albumin: 3.7 g/dL (ref 3.5–5.0)
Alkaline Phosphatase: 69 U/L (ref 38–126)
Anion gap: 8 (ref 5–15)
BUN: 11 mg/dL (ref 8–23)
CHLORIDE: 98 mmol/L (ref 98–111)
CO2: 28 mmol/L (ref 22–32)
CREATININE: 0.83 mg/dL (ref 0.61–1.24)
Calcium: 8.9 mg/dL (ref 8.9–10.3)
Glucose, Bld: 100 mg/dL — ABNORMAL HIGH (ref 70–99)
Potassium: 4.3 mmol/L (ref 3.5–5.1)
SODIUM: 134 mmol/L — AB (ref 135–145)
Total Bilirubin: 0.7 mg/dL (ref 0.3–1.2)
Total Protein: 7 g/dL (ref 6.5–8.1)

## 2018-05-21 LAB — APTT: aPTT: 31 seconds (ref 24–36)

## 2018-05-21 LAB — PROTIME-INR
INR: 1.08
Prothrombin Time: 13.9 seconds (ref 11.4–15.2)

## 2018-05-21 LAB — LIPASE, BLOOD: LIPASE: 21 U/L (ref 11–51)

## 2018-05-21 LAB — TROPONIN I: Troponin I: 0.06 ng/mL (ref <0.03)

## 2018-05-21 MED ORDER — SODIUM CHLORIDE 0.9% FLUSH
3.0000 mL | Freq: Two times a day (BID) | INTRAVENOUS | Status: DC
  Administered 2018-05-21 – 2018-05-22 (×2): 3 mL via INTRAVENOUS

## 2018-05-21 MED ORDER — IPRATROPIUM BROMIDE 0.02 % IN SOLN
0.5000 mg | Freq: Four times a day (QID) | RESPIRATORY_TRACT | Status: DC
  Administered 2018-05-22 (×3): 0.5 mg via RESPIRATORY_TRACT
  Filled 2018-05-21 (×3): qty 2.5

## 2018-05-21 MED ORDER — ALBUTEROL SULFATE (2.5 MG/3ML) 0.083% IN NEBU
5.0000 mg | INHALATION_SOLUTION | Freq: Once | RESPIRATORY_TRACT | Status: AC
  Administered 2018-05-21: 5 mg via RESPIRATORY_TRACT
  Filled 2018-05-21: qty 6

## 2018-05-21 MED ORDER — ONDANSETRON HCL 4 MG/2ML IJ SOLN
4.0000 mg | Freq: Four times a day (QID) | INTRAMUSCULAR | Status: DC | PRN
  Administered 2018-05-22: 4 mg via INTRAVENOUS
  Filled 2018-05-21: qty 2

## 2018-05-21 MED ORDER — LEVALBUTEROL HCL 1.25 MG/0.5ML IN NEBU
1.2500 mg | INHALATION_SOLUTION | Freq: Four times a day (QID) | RESPIRATORY_TRACT | Status: DC
  Filled 2018-05-21 (×2): qty 0.5

## 2018-05-21 MED ORDER — LEVALBUTEROL HCL 1.25 MG/0.5ML IN NEBU
1.2500 mg | INHALATION_SOLUTION | Freq: Four times a day (QID) | RESPIRATORY_TRACT | Status: DC
  Administered 2018-05-22 (×3): 1.25 mg via RESPIRATORY_TRACT
  Filled 2018-05-21 (×4): qty 0.5

## 2018-05-21 MED ORDER — HEPARIN BOLUS VIA INFUSION
4500.0000 [IU] | Freq: Once | INTRAVENOUS | Status: AC
  Administered 2018-05-21: 4500 [IU] via INTRAVENOUS
  Filled 2018-05-21: qty 4500

## 2018-05-21 MED ORDER — GUAIFENESIN ER 600 MG PO TB12
600.0000 mg | ORAL_TABLET | Freq: Two times a day (BID) | ORAL | Status: DC
  Administered 2018-05-21 – 2018-05-22 (×2): 600 mg via ORAL
  Filled 2018-05-21 (×2): qty 1

## 2018-05-21 MED ORDER — AMIODARONE HCL IN DEXTROSE 360-4.14 MG/200ML-% IV SOLN: 30.0000 mg/h | INTRAVENOUS | Status: DC

## 2018-05-21 MED ORDER — AMIODARONE LOAD VIA INFUSION: 150.0000 mg | Freq: Once | INTRAVENOUS | Status: DC

## 2018-05-21 MED ORDER — SODIUM CHLORIDE 0.9 % IV SOLN: 250.0000 mL | INTRAVENOUS | Status: DC | PRN

## 2018-05-21 MED ORDER — NITROGLYCERIN 0.4 MG SL SUBL: 0.4000 mg | SUBLINGUAL_TABLET | SUBLINGUAL | Status: DC | PRN

## 2018-05-21 MED ORDER — MORPHINE SULFATE (PF) 2 MG/ML IV SOLN
2.0000 mg | INTRAVENOUS | Status: DC | PRN
  Administered 2018-05-22: 2 mg via INTRAVENOUS
  Filled 2018-05-21: qty 1

## 2018-05-21 MED ORDER — ACETAMINOPHEN 650 MG RE SUPP: 650.0000 mg | Freq: Four times a day (QID) | RECTAL | Status: DC | PRN

## 2018-05-21 MED ORDER — SODIUM CHLORIDE 0.9% FLUSH: 3.0000 mL | INTRAVENOUS | Status: DC | PRN

## 2018-05-21 MED ORDER — METHYLPREDNISOLONE SODIUM SUCC 125 MG IJ SOLR
60.0000 mg | Freq: Four times a day (QID) | INTRAMUSCULAR | Status: DC
  Administered 2018-05-21 – 2018-05-22 (×4): 60 mg via INTRAVENOUS
  Filled 2018-05-21 (×4): qty 2

## 2018-05-21 MED ORDER — ONDANSETRON HCL 4 MG PO TABS: 4.0000 mg | ORAL_TABLET | Freq: Four times a day (QID) | ORAL | Status: DC | PRN

## 2018-05-21 MED ORDER — DILTIAZEM HCL 25 MG/5ML IV SOLN
10.0000 mg | Freq: Once | INTRAVENOUS | Status: AC
  Administered 2018-05-21: 10 mg via INTRAVENOUS
  Filled 2018-05-21: qty 5

## 2018-05-21 MED ORDER — NICOTINE 14 MG/24HR TD PT24
14.0000 mg | MEDICATED_PATCH | Freq: Every day | TRANSDERMAL | Status: DC
  Filled 2018-05-21: qty 1

## 2018-05-21 MED ORDER — DILTIAZEM HCL 60 MG PO TABS
30.0000 mg | ORAL_TABLET | Freq: Once | ORAL | Status: AC
  Administered 2018-05-21: 30 mg via ORAL
  Filled 2018-05-21: qty 1

## 2018-05-21 MED ORDER — SODIUM CHLORIDE 0.9 % IV SOLN
100.0000 mg | Freq: Two times a day (BID) | INTRAVENOUS | Status: DC
  Administered 2018-05-21 – 2018-05-22 (×2): 100 mg via INTRAVENOUS
  Filled 2018-05-21 (×4): qty 100

## 2018-05-21 MED ORDER — ACETAMINOPHEN 325 MG PO TABS: 650.0000 mg | ORAL_TABLET | Freq: Four times a day (QID) | ORAL | Status: DC | PRN

## 2018-05-21 MED ORDER — ASPIRIN 81 MG PO CHEW
81.0000 mg | CHEWABLE_TABLET | Freq: Every day | ORAL | Status: DC
  Administered 2018-05-22: 81 mg via ORAL
  Filled 2018-05-21: qty 1

## 2018-05-21 MED ORDER — BUDESONIDE 0.25 MG/2ML IN SUSP
0.2500 mg | Freq: Two times a day (BID) | RESPIRATORY_TRACT | Status: DC
  Administered 2018-05-22: 0.25 mg via RESPIRATORY_TRACT
  Filled 2018-05-21: qty 2

## 2018-05-21 MED ORDER — POLYETHYLENE GLYCOL 3350 17 G PO PACK: 17.0000 g | PACK | Freq: Every day | ORAL | Status: DC | PRN

## 2018-05-21 MED ORDER — FLUTICASONE PROPIONATE HFA 44 MCG/ACT IN AERO: 2.0000 | INHALATION_SPRAY | Freq: Two times a day (BID) | RESPIRATORY_TRACT | Status: DC

## 2018-05-21 MED ORDER — METOPROLOL TARTRATE 25 MG PO TABS
12.5000 mg | ORAL_TABLET | Freq: Two times a day (BID) | ORAL | Status: DC
  Administered 2018-05-21 – 2018-05-22 (×2): 12.5 mg via ORAL
  Filled 2018-05-21 (×2): qty 1

## 2018-05-21 MED ORDER — METHYLPREDNISOLONE SODIUM SUCC 125 MG IJ SOLR
125.0000 mg | INTRAMUSCULAR | Status: AC
  Administered 2018-05-21: 125 mg via INTRAVENOUS
  Filled 2018-05-21: qty 2

## 2018-05-21 MED ORDER — HEPARIN (PORCINE) IN NACL 100-0.45 UNIT/ML-% IJ SOLN: 10.0000 [IU]/kg/h | INTRAMUSCULAR | Status: DC

## 2018-05-21 MED ORDER — ACETAMINOPHEN 500 MG PO TABS: 500.0000 mg | ORAL_TABLET | Freq: Four times a day (QID) | ORAL | Status: DC | PRN

## 2018-05-21 MED ORDER — ASPIRIN 81 MG PO CHEW
324.0000 mg | CHEWABLE_TABLET | Freq: Once | ORAL | Status: AC
  Administered 2018-05-21: 324 mg via ORAL
  Filled 2018-05-21: qty 4

## 2018-05-21 MED ORDER — AMIODARONE HCL IN DEXTROSE 360-4.14 MG/200ML-% IV SOLN: 60.0000 mg/h | INTRAVENOUS | Status: DC

## 2018-05-21 MED ORDER — IOPAMIDOL (ISOVUE-370) INJECTION 76%
75.0000 mL | Freq: Once | INTRAVENOUS | Status: AC | PRN
  Administered 2018-05-21: 75 mL via INTRAVENOUS

## 2018-05-21 MED ORDER — HEPARIN (PORCINE) IN NACL 100-0.45 UNIT/ML-% IJ SOLN
1100.0000 [IU]/h | INTRAMUSCULAR | Status: DC
  Administered 2018-05-21: 1100 [IU]/h via INTRAVENOUS
  Filled 2018-05-21: qty 250

## 2018-05-21 NOTE — ED Notes (Signed)
Pt placed on oxygen at 2lpm via [place] for pox of 90% on ra after getting up to use urinal.

## 2018-05-21 NOTE — ED Notes (Signed)
Pt updated on admission process. Pt verbalizes understanding.  

## 2018-05-21 NOTE — ED Triage Notes (Signed)
C/O abdominal bloating and pain x 2-3 days.  Reports burping but decreased bowel movements.  Denies N/V.

## 2018-05-21 NOTE — ED Notes (Signed)
Date and time results received: 05/10/2018 6:54 PM   Test: Troponin Critical Value: 0.06 ng/dL  Name of Provider Notified: Dr. Fanny Bien

## 2018-05-21 NOTE — ED Notes (Signed)
Vital signs entered in error: hr 81, pox 93%, a flutter noted on monitor, devan, rn at bedside. Report from devan.

## 2018-05-21 NOTE — ED Notes (Addendum)
Crackers and water provided with consent of MD.

## 2018-05-21 NOTE — H&P (Signed)
[facility] [place] [facility]   PATIENT NAME: Mario Diaz    MR#:  [id number]  DATE OF BIRTH:  1955/12/25  DATE OF ADMISSION:  06/12/2018  PRIMARY CARE PHYSICIAN: [facility]   REQUESTING/REFERRING PHYSICIAN:   CHIEF COMPLAINT:   Chief Complaint  Patient presents with  . Shortness of Breath    HISTORY OF PRESENT ILLNESS: Mario Diaz  is a 62 y.o. male with a known history per below presented to the emergency room with 2 to 4-day history of worsening shortness of breath, dyspnea on exertion, not feeling well generalized weakness, heart palpitations, in the emergency room patient was found to have heart rate in the 140s, noted tachypnea, troponin elevation of 0.06, urinalysis negative, CT chest noted for pulmonary nodules, chest x-ray noted for COPD, AST 57, ALT 82, patient evaluated emergency room, no apparent distress, resting comfortably in bed, patient also with productive cough, intermittent chills, chest congestion, noted wheezing, patient does admit to smoking extensively, patient is now been admitted for acute new onset A. fib/a flutter with RVR, acute on COPD exacerbation.  PAST MEDICAL HISTORY:   Past Medical History:  Diagnosis Date  . Asthma   . COPD (chronic obstructive pulmonary disease) (HCC)   . Coronary artery disease   . Hypertension   . Ischemic cardiomyopathy     PAST SURGICAL HISTORY:  Past Surgical History:  Procedure Laterality Date  . CARDIAC SURGERY     double bipass  . CORONARY ARTERY BYPASS GRAFT      SOCIAL HISTORY:  Social History   Tobacco Use  . Smoking status: Current Every Day Smoker    Types: Cigarettes  . Smokeless tobacco: Never Used  Substance Use Topics  . Alcohol use: Yes    FAMILY HISTORY:  Family History  Problem Relation Age of Onset  . CAD Mother   . CAD Father   . Cancer Sister     DRUG ALLERGIES:  Allergies  Allergen Reactions  . Sulfa Antibiotics Rash    REVIEW OF  SYSTEMS:   CONSTITUTIONAL: No fever, +fatigue, weakness.  EYES: No blurred or double vision.  EARS, NOSE, AND THROAT: No tinnitus or ear pain.  RESPIRATORY: +cough, shortness of breath, wheezing  CARDIOVASCULAR: No chest pain, orthopnea, edema.  GASTROINTESTINAL: No nausea, vomiting, diarrhea or abdominal pain.  GENITOURINARY: No dysuria, hematuria.  ENDOCRINE: No polyuria, nocturia,  HEMATOLOGY: No anemia, easy bruising or bleeding SKIN: No rash or lesion. MUSCULOSKELETAL: No joint pain or arthritis.   NEUROLOGIC: No tingling, numbness, weakness.  PSYCHIATRY: No anxiety or depression.   MEDICATIONS AT HOME:  Prior to Admission medications   Medication Sig Start Date End Date Taking? Authorizing Provider  acetaminophen (TYLENOL) 500 MG tablet Take 1-2 tablets by mouth every 6 (six) hours as needed for mild pain or headache.  02/05/15  Yes [provider]  albuterol (PROVENTIL HFA;VENTOLIN HFA) 108 (90 BASE) MCG/ACT inhaler Inhale 2 puffs into the lungs every 6 (six) hours as needed for wheezing or shortness of breath. 05/29/15  Yes Darci Current, MD  aspirin 81 MG tablet Take 1 tablet by mouth daily. 02/05/15  Yes [provider]  fluticasone (FLOVENT HFA) 44 MCG/ACT inhaler Inhale 1-2 puffs into the lungs 2 (two) times daily.   Yes [provider]      PHYSICAL EXAMINATION:   VITAL SIGNS: Blood pressure 114/73, pulse 93, temperature 97.8 F (36.6 C), temperature source Oral, resp. rate 13, height 5\' 8"  (1.727 m), weight  72.6 kg, SpO2 96 %.  GENERAL:  61 y.o.-year-old patient lying in the bed with no acute distress.  Frail-appearing EYES: Pupils equal, round, reactive to light and accommodation. No scleral icterus. Extraocular muscles intact.  HEENT: Head atraumatic, normocephalic. Oropharynx and nasopharynx clear.  NECK:  Supple, no jugular venous distention. No thyroid enlargement, no tenderness.  LUNGS: Severely diminished breath sounds throughout with  wheezing/rhonchi, mild accessory muscles of respiration.  CARDIOVASCULAR: Regular rate and rhythm. No murmurs, rubs, or gallops.  ABDOMEN: Soft, nontender, nondistended. Bowel sounds present. No organomegaly or mass.  EXTREMITIES: No pedal edema, cyanosis, or clubbing.  NEUROLOGIC: Cranial nerves II through XII are intact. MAES. Gait not checked.  PSYCHIATRIC: The patient is alert and oriented x 3.  SKIN: No obvious rash, lesion, or ulcer.   LABORATORY PANEL:   CBC Recent Labs  Lab 2018-05-27 1738  WBC 8.8  HGB 15.5  HCT 47.6  PLT 211  MCV 96.9  MCH 31.6  MCHC 32.6  RDW 13.9   ------------------------------------------------------------------------------------------------------------------  Chemistries  Recent Labs  Lab 05/27/18 1738  NA 134*  K 4.3  CL 98  CO2 28  GLUCOSE 100*  BUN 11  CREATININE 0.83  CALCIUM 8.9  AST 57*  ALT 82*  ALKPHOS 69  BILITOT 0.7   ------------------------------------------------------------------------------------------------------------------ estimated creatinine clearance is 89.3 mL/min (by C-G formula based on SCr of 0.83 mg/dL). ------------------------------------------------------------------------------------------------------------------ No results for input(s): TSH, T4TOTAL, T3FREE, THYROIDAB in the last 72 hours.  Invalid input(s): FREET3   Coagulation profile Recent Labs  Lab 2018/05/27 1833  INR 1.08   ------------------------------------------------------------------------------------------------------------------- No results for input(s): DDIMER in the last 72 hours. -------------------------------------------------------------------------------------------------------------------  Cardiac Enzymes Recent Labs  Lab May 27, 2018 1825  TROPONINI 0.06*   ------------------------------------------------------------------------------------------------------------------ Invalid input(s):  POCBNP  ---------------------------------------------------------------------------------------------------------------  Urinalysis    Component Value Date/Time   COLORURINE STRAW (A) 2018-05-27 1738   APPEARANCEUR CLEAR (A) May 27, 2018 1738   LABSPEC 1.002 (L) May 27, 2018 1738   PHURINE 6.0 05-27-2018 1738   GLUCOSEU NEGATIVE May 27, 2018 1738   HGBUR NEGATIVE 05/27/2018 1738   BILIRUBINUR NEGATIVE 27-May-2018 1738   KETONESUR NEGATIVE 05/27/18 1738   PROTEINUR NEGATIVE 2018-05-27 1738   NITRITE NEGATIVE 05/27/2018 1738   LEUKOCYTESUR NEGATIVE 05/27/18 1738     RADIOLOGY: Ct Angio Chest Pe W And/or Wo Contrast  Result Date: 05-27-18 CLINICAL DATA:  Chest pain or shortness of breath.  Pleurisy EXAM: CT ANGIOGRAPHY CHEST WITH CONTRAST TECHNIQUE: Multidetector CT imaging of the chest was performed using the standard protocol during bolus administration of intravenous contrast. Multiplanar CT image reconstructions and MIPs were obtained to evaluate the vascular anatomy. CONTRAST:  75mL ISOVUE-370 IOPAMIDOL (ISOVUE-370) INJECTION 76% COMPARISON:  03/23/2013 FINDINGS: Cardiovascular: Negative for pulmonary embolism. Cardiomegaly. No pericardial effusion. Extensive atherosclerosis of the aorta and coronaries. Status post CABG Mediastinum/Nodes: Negative for adenopathy Lungs/Pleura: Emphysema and generalized airway thickening. Small right pleural effusion with mild right base atelectasis. Subpleural density in the left upper lobe measuring 12 mm, just below a sternotomy, mildly increased from 2014 when it measured 10 mm. There is also an irregularly-shaped nodular density in the subpleural left upper lobe measuring 18 x 8 mm that is new from 2014. Upper Abdomen: Partially covered left renal cystic density. Musculoskeletal: Nonunited sternotomy. No acute or destructive finding. Review of the MIP images confirms the above findings. IMPRESSION: 1. Negative for pulmonary embolism. 2. Small right  pleural effusion with mild atelectasis. 3. Two nodules in the left upper lobe measuring up to 18 x 8 mm-new/progressive from 2014.  Consider one of the following in 3 months for both low-risk and high-risk individuals: (a) repeat chest CT, (b) follow-up PET-CT, or (c) tissue sampling. This recommendation follows the consensus statement: Guidelines for Management of Incidental Pulmonary Nodules Detected on CT Images: From the Fleischner Society 2017; Radiology 2017; 284:228-243. 4. Chronic nonunion of sternotomy. 5. Cardiomegaly. 6. Emphysema. Electronically Signed   By: Marnee Spring M.D.   On: 2018/05/26 19:20   Dg Chest Port 1 View  Result Date: May 26, 2018 CLINICAL DATA:  Worsening dyspnea for 2-3 days. EXAM: PORTABLE CHEST 1 VIEW COMPARISON:  05/29/2015 FINDINGS: Cardiomegaly with aortic atherosclerosis. Emphysematous hyperinflation of the lungs, upper lobe predominant is redemonstrated with bibasilar right greater than left atelectasis. No overt pulmonary edema or pneumothorax. Median sternotomy sutures are noted similar in appearance. ACDF of the lower included cervical spine. IMPRESSION: Cardiomegaly with aortic atherosclerosis and COPD. Bibasilar right greater than left atelectasis is identified. Electronically Signed   By: Tollie Eth M.D.   On: 2018-05-26 18:43    EKG: Orders placed or performed during the hospital encounter of 05/26/2018  . ED EKG  . ED EKG  . EKG 12-Lead  . EKG 12-Lead  . EKG 12-Lead  . EKG 12-Lead  . EKG 12-Lead  . EKG 12-Lead  . ED EKG  . ED EKG    IMPRESSION AND PLAN: *Acute new onset A. fib/a flutter with RVR Admit to telemetry bed, rule out acute coronary syndrome with cardiac enzymes x3 sets, cardiology to see, check echocardiogram, heparin drip for now, amiodarone drip given relative hypotension, low-dose beta-blocker if blood pressure will tolerate, supplemental oxygen wean as tolerated, check blood cultures  *Acute on COPD exacerbation IV Solu-Medrol  with tapering as tolerated, aggressive pulmonary toilet with bronchodilator therapy, mucolytic agents, empiric IV doxycycline, check sputum cultures, respiratory therapy to see, supplemental oxygen wean as tolerated  *Chronic pulmonary nodules Per patient-patient were told that these were benign Will need to follow-up with primary care provider status post discharge for continued care/medical management  *History of hypertension Currently with relative hypotension, not on antihypertensives Vitals per routine, make changes as per necessary  *Chronic tobacco smoking abuse/dependency Nicotine patch and cessation counseling ordered  *Acute elevated liver function tests Check CMP in the morning, check hepatitis panel, avoid hepatotoxic agents, check INR  *Malnourished appearance Dietary to see    All the records are reviewed and case discussed with ED provider. Management plans discussed with the patient, family and they are in agreement.  CODE STATUS:full Code Status History    Date Active Date Inactive Code Status Order ID Comments User Context   05/08/2015 0818 05/09/2015 1935 Full Code [id number]  Crissie Figures, MD Inpatient       TOTAL TIME TAKING CARE OF THIS PATIENT: 45 minutes.    Evelena Asa Danuel Felicetti M.D on May 26, 2018   Between 7am to 6pm - Pager - [phone]  After 6pm go to [email or web address] - Social research officer, government  [facility]  Office  [phone]  CC: Primary care physician; [facility]   Note: This dictation was prepared with Dragon dictation along with smaller phrase technology. Any transcriptional errors that result from this process are unintentional.

## 2018-05-21 NOTE — ED Notes (Signed)
Pt receiving ultrasound in room.

## 2018-05-21 NOTE — ED Notes (Signed)
Faint wheezes noted bilaterally after nebulizer.

## 2018-05-21 NOTE — ED Triage Notes (Signed)
States he has been short of breath for a few days. Pt is winded when talking. Denies chest pain. C/o abdominal bloating.

## 2018-05-21 NOTE — Progress Notes (Signed)
ANTICOAGULATION CONSULT NOTE - Initial Consult  Pharmacy Consult for heparin drip Indication: atrial fibrillation  Allergies  Allergen Reactions  . Sulfa Antibiotics Rash    Patient Measurements: Height: 5\' 8"  (172.7 cm) Weight: 164 lb 9.6 oz (74.7 kg) IBW/kg (Calculated) : 68.4 Heparin Dosing Weight: 75 kg  Vital Signs: Temp: 98.4 F (36.9 C) (10/18 2219) Temp Source: Oral (10/18 2219) BP: 136/116 (10/18 2219) Pulse Rate: 138 (10/18 2219)  Labs: Recent Labs    09-Jun-2018 1738 09-Jun-2018 1825 06-09-18 1833  HGB 15.5  --   --   HCT 47.6  --   --   PLT 211  --   --   APTT  --   --  31  LABPROT  --   --  13.9  INR  --   --  1.08  CREATININE 0.83  --   --   TROPONINI  --  0.06*  --     Estimated Creatinine Clearance: 89.3 mL/min (by C-G formula based on SCr of 0.83 mg/dL).   Medical History: Past Medical History:  Diagnosis Date  . Asthma   . COPD (chronic obstructive pulmonary disease) (HCC)   . Coronary artery disease   . Hypertension   . Ischemic cardiomyopathy     Medications:  No anticoagulation in PTA meds  Assessment: Trop 0.06  Goal of Therapy:  Heparin level 0.3-0.7 units/ml Monitor platelets by anticoagulation protocol: Yes   Plan:  4500 unit bolus and initial rate of 1100 units/hr. First heparin level 6 hours after start of infusion  Mario Diaz 06-09-18,10:46 PM

## 2018-05-21 NOTE — ED Notes (Signed)
April RN, aware of bed assigned   

## 2018-05-21 NOTE — ED Provider Notes (Addendum)
[facility] Emergency Department Provider Note ____________________________________________   First MD Initiated Contact with Patient 2018/06/06 1815     (approximate)  I have reviewed the triage vital signs and the nursing notes.  HISTORY  Chief Complaint Shortness of Breath  HPI Mario Diaz is a 62 y.o. male who has a history of previous coronary disease  Also COPD.  Patient reports that for about 2 to 3 days been experiencing intermittent slight bloating feeling, was not quite sure what is and today began to wonder if it could be his heart causing problems.  Denies chest pain  No fevers or chills.  Does report he started feeling short of breath the last day as well, no cough.  Denies abdominal pain, just reports he felt like a strange feeling of occasional bloating in the abdomen over last to 3 days.  Started experiencing shortness of breath earlier today.  Denies history of arrhythmia.   Past Medical History:  Diagnosis Date  . Asthma   . COPD (chronic obstructive pulmonary disease) (HCC)   . Coronary artery disease   . Hypertension   . Ischemic cardiomyopathy     Patient Active Problem List   Diagnosis Date Noted  . Near syncope 05/08/2015  . CAD (coronary artery disease) 05/08/2015  . COPD (chronic obstructive pulmonary disease) (HCC) 05/08/2015  . HTN (hypertension) 05/08/2015  . Ischemic cardiomyopathy 05/08/2015    Past Surgical History:  Procedure Laterality Date  . CARDIAC SURGERY     double bipass  . CORONARY ARTERY BYPASS GRAFT      Prior to Admission medications   Medication Sig Start Date End Date Taking? Authorizing Provider  acetaminophen (TYLENOL) 500 MG tablet Take 1 tablet by mouth as needed. 02/05/15   [provider]  albuterol (PROVENTIL HFA;VENTOLIN HFA) 108 (90 BASE) MCG/ACT inhaler Inhale 2 puffs into the lungs every 6 (six) hours as needed for wheezing or shortness of breath. 05/29/15   Darci Current,  MD  aspirin 81 MG tablet Take 1 tablet by mouth daily. 02/05/15   [provider]  lisinopril (PRINIVIL,ZESTRIL) 10 MG tablet Take 1 tablet by mouth daily. 02/01/15   [provider]    Allergies Sulfa antibiotics  Family History  Problem Relation Age of Onset  . CAD Mother   . CAD Father   . Cancer Sister     Social History Social History   Tobacco Use  . Smoking status: Current Every Day Smoker    Types: Cigarettes  . Smokeless tobacco: Never Used  Substance Use Topics  . Alcohol use: Yes  . Drug use: No    Review of Systems Constitutional: No fever/chills Eyes: No visual changes. ENT: No sore throat. Cardiovascular: Denies chest pain. Respiratory: See HPI Gastrointestinal: No abdominal pain.  Intermittent feeling of bloating throughout the abdomen for 2 to 3 days Genitourinary: Negative for dysuria. Musculoskeletal: Negative for back pain. Skin: Negative for rash. Neurological: Negative for headaches, areas of focal weakness or numbness.    ____________________________________________   PHYSICAL EXAM:  VITAL SIGNS: ED Triage Vitals  Enc Vitals Group     BP Jun 06, 2018 1733 105/69     Pulse Rate June 06, 2018 1733 (!) 140     Resp --      Temp 2018-06-06 1733 97.8 F (36.6 C)     Temp Source 06/06/18 1733 Oral     SpO2 06/06/2018 1733 100 %     Weight 2018-06-06 1722 149 lb 14.6 oz (68 kg)  Height 05/29/2018 1814 5\' 8"  (1.727 m)     Head Circumference --      Peak Flow --      Pain Score 05/07/2018 1721 9     Pain Loc --      Pain Edu? --      Excl. in GC? --     Constitutional: Alert and oriented. Well appearing and in no acute distress. Eyes: Conjunctivae are normal. Head: Atraumatic. Nose: No congestion/rhinnorhea. Mouth/Throat: Mucous membranes are moist. Neck: No stridor.  Cardiovascular: Tachycardic, regular.  Grossly normal heart sounds.  Good peripheral circulation. Respiratory: Slight tachypnea, minimally diminished lung sounds  throughout.  Speaks in full sentences.  Mild increased work of breathing.  No focal crackles or rales. Gastrointestinal: Soft and nontender. No distention. Musculoskeletal: No lower extremity tenderness nor edema. Neurologic:  Normal speech and language. No gross focal neurologic deficits are appreciated.  Skin:  Skin is warm, dry and intact. No rash noted. Psychiatric: Mood and affect are normal. Speech and behavior are normal.  ____________________________________________   LABS (all labs ordered are listed, but only abnormal results are displayed)  Labs Reviewed  COMPREHENSIVE METABOLIC PANEL - Abnormal; Notable for the following components:      Result Value   Sodium 134 (*)    Glucose, Bld 100 (*)    AST 57 (*)    ALT 82 (*)    All other components within normal limits  URINALYSIS, COMPLETE (UACMP) WITH MICROSCOPIC - Abnormal; Notable for the following components:   Color, Urine STRAW (*)    APPearance CLEAR (*)    Specific Gravity, Urine 1.002 (*)    All other components within normal limits  LIPASE, BLOOD  CBC  TROPONIN I  PROTIME-INR  APTT   ____________________________________________  EKG  Performed at 1810 Heart rate 140 QRS 120 QTc 500 tachycardia, consider possible flutter versus atrial tachycardia.  ST elevation is notable in V2 through V4, however consider possible rate related change versus true STEMI like ischemia.  ----------------------------------------- 6:43 PM on 05/10/2018 -----------------------------------------  Post diltiazem EKG reviewed at 1840 Heart rate 100 QRS 130 QTc 470 Atrial flutter, ventricular rate approximately 100.  ST abnormality is resolving, no evidence of STEMI.  Nonspecific T wave abnormality. ____________________________________________  RADIOLOGY   CT scan results reviewed by me, no PE.  IMPRESSION: 1. Negative for pulmonary embolism. 2. Small right pleural effusion with mild atelectasis. 3. Two nodules in the  left upper lobe measuring up to 18 x 8 mm-new/progressive from 2014. Consider one of the following in 3 months for both low-risk and high-risk individuals: (a) repeat chest CT, (b) follow-up PET-CT, or (c) tissue sampling. This recommendation follows the consensus statement: Guidelines for Management of Incidental Pulmonary Nodules Detected on CT Images: From the Fleischner Society 2017; Radiology 2017; 284:228-243. 4. Chronic nonunion of sternotomy. 5. Cardiomegaly. 6. Emphysema. ____________________________________________   PROCEDURES  Procedure(s) performed: None  Procedures  Critical Care performed: Yes, see critical care note(s)  CRITICAL CARE Performed by: Sharyn Creamer   Total critical care time: 37 minutes  Critical care time was exclusive of separately billable procedures and treating other patients.  Critical care was necessary to treat or prevent imminent or life-threatening deterioration.  Critical care was time spent personally by me on the following activities: development of treatment plan with patient and/or surrogate as well as nursing, discussions with consultants, evaluation of patient's response to treatment, examination of patient, obtaining history from patient or surrogate, ordering and performing treatments and interventions, ordering  and review of laboratory studies, ordering and review of radiographic studies, pulse oximetry and re-evaluation of patient's condition.  ____________________________________________   INITIAL IMPRESSION / ASSESSMENT AND PLAN / ED COURSE  Pertinent labs & imaging results that were available during my care of the patient were reviewed by me and considered in my medical decision making (see chart for details).  Patient returns for evaluation of intermittent bloating feeling,  Clinical Course as of May 21 1844  Fri 06/16/18  1823 EKG has been discussed and being reviewed by Dr. Kirke Corin (STEMI Call MD).  Patient symptoms  of 2 to 3 days of abdominal bloating are a little atypical of ACS, but his EKG shows concerning morphology in V2 through V4.  Also the underlying rhythm is slightly unclear question if possible flutter vs sinus tach.   Dr. Kirke Corin reviewing EKG. Clinical case discussed with Dr. Kirke Corin at this time.   [MQ]  1832 Dr. Kirke Corin advised possible atrial tachycardia versus atrial flutter with associated rate related changes.  Advises trial of 10 mg IV diltiazem at this time.  No STEMI activation.    [MQ]    Clinical Course User Index [MQ] Sharyn Creamer, MD   Vital signs of heart rate of 29 oxygen saturation 87% are erroneous.  Heart rate currently 98, saturation 92% on room air, blood pressure 103/83.  Discussed with patient he feels much improved, resting comfortably at this time.  Remains in a flutter.  ----------------------------------------- 7:31 PM on June 16, 2018 -----------------------------------------  Patient must improve, give low-dose oral diltiazem for rate control.  Currently much improved.  Admit, new onset atrial flutter, suspect possible underlying COPD exacerbation as well.  ____________________________________________   FINAL CLINICAL IMPRESSION(S) / ED DIAGNOSES  Final diagnoses:  COPD exacerbation (HCC)  Atrial flutter, unspecified type [facility])        Note:  This document was prepared using Dragon voice recognition software and may include unintentional dictation errors       Sharyn Creamer, MD 06-16-18 1932    Sharyn Creamer, MD 06/16/2018 1943

## 2018-05-22 ENCOUNTER — Inpatient Hospital Stay: Admit: 2018-05-22 | Payer: Medicare HMO

## 2018-05-22 ENCOUNTER — Encounter: Payer: Self-pay | Admitting: Internal Medicine

## 2018-05-22 DIAGNOSIS — I469 Cardiac arrest, cause unspecified: Secondary | ICD-10-CM

## 2018-05-22 LAB — COMPREHENSIVE METABOLIC PANEL
ALBUMIN: 3.5 g/dL (ref 3.5–5.0)
ALT: 79 U/L — AB (ref 0–44)
AST: 55 U/L — AB (ref 15–41)
Alkaline Phosphatase: 62 U/L (ref 38–126)
Anion gap: 10 (ref 5–15)
BUN: 15 mg/dL (ref 8–23)
CHLORIDE: 98 mmol/L (ref 98–111)
CO2: 25 mmol/L (ref 22–32)
CREATININE: 0.9 mg/dL (ref 0.61–1.24)
Calcium: 8.6 mg/dL — ABNORMAL LOW (ref 8.9–10.3)
GFR calc Af Amer: >60 mL/min (ref >60)
GFR calc non Af Amer: >60 mL/min (ref >60)
GLUCOSE: 166 mg/dL — AB (ref 70–99)
POTASSIUM: 5.2 mmol/L — AB (ref 3.5–5.1)
Sodium: 133 mmol/L — ABNORMAL LOW (ref 135–145)
Total Bilirubin: 0.7 mg/dL (ref 0.3–1.2)
Total Protein: 6.8 g/dL (ref 6.5–8.1)

## 2018-05-22 LAB — TROPONIN I
TROPONIN I: 0.04 ng/mL — AB (ref <0.03)
Troponin I: 0.05 ng/mL (ref <0.03)
Troponin I: 0.04 ng/mL (ref <0.03)

## 2018-05-22 LAB — HEPARIN LEVEL (UNFRACTIONATED)
HEPARIN UNFRACTIONATED: 0.32 [IU]/mL (ref 0.30–0.70)
HEPARIN UNFRACTIONATED: 0.29 [IU]/mL — AB (ref 0.30–0.70)

## 2018-05-22 LAB — BLOOD GAS, ARTERIAL
ACID-BASE DEFICIT: 22 mmol/L — AB (ref 0.0–2.0)
Bicarbonate: 11.4 mmol/L — ABNORMAL LOW (ref 20.0–28.0)
FIO2: 1
O2 Saturation: 76.7 %
PCO2 ART: 57 mmHg — AB (ref 32.0–48.0)
PH ART: 6.91 — AB (ref 7.350–7.450)
Patient temperature: 37
pO2, Arterial: 70 mmHg — ABNORMAL LOW (ref 83.0–108.0)

## 2018-05-22 LAB — GLUCOSE, CAPILLARY
Glucose-Capillary: 133 mg/dL — ABNORMAL HIGH (ref 70–99)
Glucose-Capillary: 132 mg/dL — ABNORMAL HIGH (ref 70–99)
Glucose-Capillary: 141 mg/dL — ABNORMAL HIGH (ref 70–99)
Glucose-Capillary: 113 mg/dL — ABNORMAL HIGH (ref 70–99)

## 2018-05-22 LAB — TSH: TSH: 0.695 u[IU]/mL (ref 0.350–4.500)

## 2018-05-22 LAB — MAGNESIUM: Magnesium: 2.1 mg/dL (ref 1.7–2.4)

## 2018-05-22 MED ORDER — ALUM & MAG HYDROXIDE-SIMETH 200-200-20 MG/5ML PO SUSP
30.0000 mL | Freq: Four times a day (QID) | ORAL | Status: DC | PRN
  Administered 2018-05-22: 30 mL via ORAL
  Filled 2018-05-22 (×2): qty 30

## 2018-05-22 MED ORDER — SODIUM CHLORIDE 0.9 % IV BOLUS
500.0000 mL | Freq: Once | INTRAVENOUS | Status: AC
  Administered 2018-05-22: 500 mL via INTRAVENOUS

## 2018-05-22 MED ORDER — DOPAMINE-DEXTROSE 3.2-5 MG/ML-% IV SOLN: 0.0000 ug/kg/min | INTRAVENOUS | Status: DC

## 2018-05-22 MED ORDER — NOREPINEPHRINE 4 MG/250ML-% IV SOLN: 0.0000 ug/min | INTRAVENOUS | Status: DC

## 2018-05-22 MED ORDER — HEPARIN BOLUS VIA INFUSION
1100.0000 [IU] | Freq: Once | INTRAVENOUS | Status: AC
  Administered 2018-05-22: 1100 [IU] via INTRAVENOUS
  Filled 2018-05-22: qty 1100

## 2018-05-22 MED ORDER — HEPARIN (PORCINE) IN NACL 100-0.45 UNIT/ML-% IJ SOLN
1250.0000 [IU]/h | INTRAMUSCULAR | Status: DC
  Administered 2018-05-22: 1250 [IU]/h via INTRAVENOUS
  Filled 2018-05-22: qty 250

## 2018-05-22 MED ORDER — DILTIAZEM HCL ER COATED BEADS 240 MG PO CP24
240.0000 mg | ORAL_CAPSULE | Freq: Every day | ORAL | Status: DC
  Administered 2018-05-22: 240 mg via ORAL
  Filled 2018-05-22: qty 2

## 2018-05-23 LAB — HEPATITIS PANEL, ACUTE
HCV Ab: <0.1 s/co ratio (ref 0.0–0.9)
HEP A IGM: NEGATIVE
HEP B C IGM: NEGATIVE
Hepatitis B Surface Ag: NEGATIVE

## 2018-05-23 MED FILL — Medication: Qty: 1 | Status: AC

## 2018-05-25 ENCOUNTER — Telehealth: Payer: Self-pay | Admitting: Pulmonary Disease

## 2018-05-25 LAB — HIV ANTIBODY (ROUTINE TESTING W REFLEX): HIV Screen 4th Generation wRfx: NONREACTIVE

## 2018-05-25 NOTE — Telephone Encounter (Signed)
Death Certificate received and placed in Dr. Georgann Housekeeper box.

## 2018-05-25 NOTE — Telephone Encounter (Signed)
[facility] [employer] dropped off Death Certificate to be completed and signed Placed in nurse box

## 2018-05-25 NOTE — Telephone Encounter (Signed)
Death Certificate complete. Notified Jimmy at [facility] ready for pickup.

## 2018-05-27 LAB — CULTURE, BLOOD (ROUTINE X 2)
CULTURE: NO GROWTH
Culture: NO GROWTH

## 2018-06-04 NOTE — Progress Notes (Signed)
   05/18/2018 1756  Clinical Encounter Type  Visited With Patient not available  Visit Type Follow-up;Spiritual support;Code  Referral From Nurse  Consult/Referral To Chaplain  Spiritual Encounters  Spiritual Needs Prayer;Other (Comment)   [facility] received a second code blue page for Mario Diaz. I provided a pastoral presences and provided silent pray and active support for the care team.

## 2018-06-04 NOTE — Progress Notes (Signed)
[facility] - [place] [facility]   PATIENT NAME: Mario Diaz    MR#:  [id number]  DATE OF BIRTH:  [birth year]  SUBJECTIVE:  CHIEF COMPLAINT:   Chief Complaint  Patient presents with  . Shortness of Breath   Continues to have SOB On O2 Tachycardia into 140  REVIEW OF SYSTEMS:    Review of Systems  Constitutional: Positive for malaise/fatigue. Negative for chills and fever.  HENT: Negative for sore throat.   Eyes: Negative for blurred vision, double vision and pain.  Respiratory: Positive for cough, shortness of breath and wheezing. Negative for hemoptysis.   Cardiovascular: Positive for palpitations. Negative for chest pain, orthopnea and leg swelling.  Gastrointestinal: Negative for abdominal pain, constipation, diarrhea, heartburn, nausea and vomiting.  Genitourinary: Negative for dysuria and hematuria.  Musculoskeletal: Negative for back pain and joint pain.  Skin: Negative for rash.  Neurological: Negative for sensory change, speech change, focal weakness and headaches.  Endo/Heme/Allergies: Does not bruise/bleed easily.  Psychiatric/Behavioral: Negative for depression. The patient is not nervous/anxious.     DRUG ALLERGIES:   Allergies  Allergen Reactions  . Sulfa Antibiotics Rash    VITALS:  Blood pressure 120/90, pulse (!) 128, temperature 97.7 F (36.5 C), temperature source Oral, resp. rate 18, height 5\' 8"  (1.727 m), weight 74.7 kg, SpO2 96 %.  PHYSICAL EXAMINATION:   Physical Exam  GENERAL:  62 y.o.-year-old patient lying in the bed with resp distress EYES: Pupils equal, round, reactive to light and accommodation. No scleral icterus. Extraocular muscles intact.  HEENT: Head atraumatic, normocephalic. Oropharynx and nasopharynx clear.  NECK:  Supple, no jugular venous distention. No thyroid enlargement, no tenderness.  LUNGS: Increase work of breathing. B/L wheezing  CARDIOVASCULAR: Irregularly irregular and tachycardia ABDOMEN: Soft,  nontender, nondistended. Bowel sounds present. No organomegaly or mass.  EXTREMITIES: No cyanosis, clubbing or edema b/l.    NEUROLOGIC: Cranial nerves II through XII are intact. No focal Motor or sensory deficits b/l.   PSYCHIATRIC: The patient is alert and oriented x 3.  SKIN: No obvious rash, lesion, or ulcer.   LABORATORY PANEL:   CBC Recent Labs  Lab 05/20/2018 1738  WBC 8.8  HGB 15.5  HCT 47.6  PLT 211   ------------------------------------------------------------------------------------------------------------------ Chemistries  Recent Labs  Lab 2018-05-23 0421  NA 133*  K 5.2*  CL 98  CO2 25  GLUCOSE 166*  BUN 15  CREATININE 0.90  CALCIUM 8.6*  MG 2.1  AST 55*  ALT 79*  ALKPHOS 62  BILITOT 0.7   ------------------------------------------------------------------------------------------------------------------  Cardiac Enzymes Recent Labs  Lab 2018/05/23 1102  TROPONINI 0.04*   ------------------------------------------------------------------------------------------------------------------  RADIOLOGY:  Ct Angio Chest Pe W And/or Wo Contrast  Result Date: 05/21/2018 CLINICAL DATA:  Chest pain or shortness of breath.  Pleurisy EXAM: CT ANGIOGRAPHY CHEST WITH CONTRAST TECHNIQUE: Multidetector CT imaging of the chest was performed using the standard protocol during bolus administration of intravenous contrast. Multiplanar CT image reconstructions and MIPs were obtained to evaluate the vascular anatomy. CONTRAST:  75mL ISOVUE-370 IOPAMIDOL (ISOVUE-370) INJECTION 76% COMPARISON:  03/23/2013 FINDINGS: Cardiovascular: Negative for pulmonary embolism. Cardiomegaly. No pericardial effusion. Extensive atherosclerosis of the aorta and coronaries. Status post CABG Mediastinum/Nodes: Negative for adenopathy Lungs/Pleura: Emphysema and generalized airway thickening. Small right pleural effusion with mild right base atelectasis. Subpleural density in the left upper lobe measuring  12 mm, just below a sternotomy, mildly increased from 2014 when it measured 10 mm. There is also an irregularly-shaped nodular density in the subpleural left  upper lobe measuring 18 x 8 mm that is new from 2014. Upper Abdomen: Partially covered left renal cystic density. Musculoskeletal: Nonunited sternotomy. No acute or destructive finding. Review of the MIP images confirms the above findings. IMPRESSION: 1. Negative for pulmonary embolism. 2. Small right pleural effusion with mild atelectasis. 3. Two nodules in the left upper lobe measuring up to 18 x 8 mm-new/progressive from 2014. Consider one of the following in 3 months for both low-risk and high-risk individuals: (a) repeat chest CT, (b) follow-up PET-CT, or (c) tissue sampling. This recommendation follows the consensus statement: Guidelines for Management of Incidental Pulmonary Nodules Detected on CT Images: From the Fleischner Society 2017; Radiology 2017; 284:228-243. 4. Chronic nonunion of sternotomy. 5. Cardiomegaly. 6. Emphysema. Electronically Signed   By: Marnee Spring M.D.   On: 06/03/2018 19:20   Dg Chest Port 1 View  Result Date: 05/13/2018 CLINICAL DATA:  Worsening dyspnea for 2-3 days. EXAM: PORTABLE CHEST 1 VIEW COMPARISON:  05/29/2015 FINDINGS: Cardiomegaly with aortic atherosclerosis. Emphysematous hyperinflation of the lungs, upper lobe predominant is redemonstrated with bibasilar right greater than left atelectasis. No overt pulmonary edema or pneumothorax. Median sternotomy sutures are noted similar in appearance. ACDF of the lower included cervical spine. IMPRESSION: Cardiomegaly with aortic atherosclerosis and COPD. Bibasilar right greater than left atelectasis is identified. Electronically Signed   By: Tollie Eth M.D.   On: 05/24/2018 18:43   US Abdomen Limited Ruq  Result Date: 05/15/2018 CLINICAL DATA:  Transaminitis. EXAM: ULTRASOUND ABDOMEN LIMITED RIGHT UPPER QUADRANT COMPARISON:  Chest CT earlier this day. FINDINGS:  Gallbladder: Physiologically distended with diffuse gallbladder wall thickening of 9 mm. Possible trace pericholecystic fluid. No gallstones. No significant intraluminal sludge. No sonographic Murphy sign noted by sonographer. Common bile duct: Diameter: 2-3 mm, normal. Liver: No focal lesion identified. Within normal limits in parenchymal echogenicity. Portal vein is patent on color Doppler imaging with normal direction of blood flow towards the liver. Incidental right pleural effusion. IMPRESSION: 1. Diffuse gallbladder wall thickening with possible trace pericholecystic fluid. No gallstones or sonographic Murphy sign. Gallbladder findings may be secondary to hepatic disease or passive hepatic congestion versus acalculous cholecystitis. 2. Normal sonographic appearance of the liver. No biliary dilatation. Electronically Signed   By: Narda Rutherford M.D.   On: 05/19/2018 22:12     ASSESSMENT AND PLAN:   * Acute copd exacerbation with acute hypoxic resp failure -IV steroids, Antibiotics - Scheduled Nebulizers - Inhalers -Wean O2 as tolerated - Consult pulmonary if no improvement  * Afib with RVR due to COPD and resp failure STAT dose cardizem to stabilize heart rate On metoprolol Heparin drip Appreciate cardiology input  * HTN Well controlled  * Tobacco abuse Counseled to quit > 3 minutes   All the records are reviewed and case discussed with Care Management/Social Worker Management plans discussed with the patient, family and they are in agreement.  CODE STATUS: FULL CODE  TOTAL CRITICAL CARE TIME TAKING CARE OF THIS PATIENT: 30 minutes.   POSSIBLE D/C IN 2-3 DAYS, DEPENDING ON CLINICAL CONDITION.  Molinda Bailiff Shaelyn Decarli M.D on 06/11/2018 at 12:42 PM  Between 7am to 6pm - Pager - [phone]  After 6pm go to [email or web address] - password [email or web address]  [facility] [place] [facility]  Office  [phone]  CC: Primary care physician; Center, [facility]  Note: This dictation  was prepared with Dragon dictation along with smaller phrase technology. Any transcriptional errors that result from this process are unintentional.

## 2018-06-04 NOTE — Progress Notes (Signed)
   2018-05-30 1945  Clinical Encounter Type  Visited With Family  Visit Type Follow-up;Spiritual support;Death  Referral From Nurse  Consult/Referral To Chaplain  Spiritual Encounters  Spiritual Needs Emotional;Grief support   [facility] received a page that the family of Mario Diaz had returned. I met with the family and their pastor who had just arrived. The pastor was going to care for the family from this point. I offered assistance if needed.

## 2018-06-04 NOTE — ED Provider Notes (Signed)
[facility]  Department of Emergency Medicine   Code Blue CONSULT NOTE  Chief Complaint: Cardiac arrest/unresponsive   Level V Caveat: Unresponsive  History of present illness: I was contacted by the hospital for a CODE BLUE cardiac arrest upstairs and presented to the patient's bedside.   On arrival, staff reported that the patient is being treated for atrial fibrillation with heparin and diltiazem infusions with a plan for cardioversion in 2 days.  2 hours ago they checked his blood sugar and it was 140, on rechecking the patient they noticed that he had become bradycardic and unresponsive.  ROS: Unable to obtain, Level V caveat  Scheduled Meds: . aspirin  81 mg Oral Daily  . budesonide (PULMICORT) nebulizer solution  0.25 mg Nebulization BID  . diltiazem  240 mg Oral Daily  . guaiFENesin  600 mg Oral BID  . ipratropium  0.5 mg Nebulization Q6H  . levalbuterol  1.25 mg Nebulization Q6H  . methylPREDNISolone (SOLU-MEDROL) injection  60 mg Intravenous Q6H  . nicotine  14 mg Transdermal Daily  . sodium chloride flush  3 mL Intravenous Q12H   Continuous Infusions: . sodium chloride    . doxycycline (VIBRAMYCIN) IV 100 mg (05/27/18 1100)  . heparin 1,250 Units/hr (05-27-18 1406)   PRN Meds:.sodium chloride, acetaminophen **OR** acetaminophen, alum & mag hydroxide-simeth, morphine injection, nitroGLYCERIN, ondansetron **OR** ondansetron (ZOFRAN) IV, polyethylene glycol, sodium chloride flush Past Medical History:  Diagnosis Date  . Asthma   . COPD (chronic obstructive pulmonary disease) (HCC)   . Coronary artery disease   . Hypertension   . Ischemic cardiomyopathy    Past Surgical History:  Procedure Laterality Date  . CARDIAC SURGERY     double bipass  . CORONARY ARTERY BYPASS GRAFT     Social History   Socioeconomic History  . Marital status: Divorced    Spouse name: Not on file  . Number of children: Not on file  . Years of education: Not on file  .  Highest education level: Not on file  Occupational History  . Not on file  Social Needs  . Financial resource strain: Not on file  . Food insecurity:    Worry: Not on file    Inability: Not on file  . Transportation needs:    Medical: Not on file    Non-medical: Not on file  Tobacco Use  . Smoking status: Current Every Day Smoker    Types: Cigarettes  . Smokeless tobacco: Never Used  Substance and Sexual Activity  . Alcohol use: Yes  . Drug use: No  . Sexual activity: Never  Lifestyle  . Physical activity:    Days per week: Not on file    Minutes per session: Not on file  . Stress: Not on file  Relationships  . Social connections:    Talks on phone: Not on file    Gets together: Not on file    Attends religious service: Not on file    Active member of club or organization: Not on file    Attends meetings of clubs or organizations: Not on file    Relationship status: Not on file  . Intimate partner violence:    Fear of current or ex partner: Not on file    Emotionally abused: Not on file    Physically abused: Not on file    Forced sexual activity: Not on file  Other Topics Concern  . Not on file  Social History Narrative  . Not on  file   Allergies  Allergen Reactions  . Sulfa Antibiotics Rash    Last set of Vital Signs (not current) Vitals:   05/25/2018 1737 05/09/2018 1747  BP: (!) 121/109 (!) 169/110  Pulse:    Resp:    Temp:    SpO2:        Physical Exam  Gen: unresponsive Cardiovascular: pulseless .  Monitor shows wide-complex bradycardia rate of 40-60 Resp: Agonal. Breath sounds equal bilaterally with bagging  Abd: nondistended  Neuro: GCS 5, unresponsive to pain  HEENT: No blood in posterior pharynx, gag reflex absent .  Gaze fixed to the left. Neck: No crepitus  Musculoskeletal: No deformity  Skin: warm.  Hands pale  Procedures  INTUBATION Performed by: Scotty Court, Jade Burkard Required items: required blood products, implants, devices, and special  equipment available Patient identity confirmed: provided demographic data and hospital-assigned identification number Time out: Immediately prior to procedure a "time out" was called to verify the correct patient, procedure, equipment, support staff and site/side marked as required. Indications: Airway protection Intubation method: Direct laryngoscopy Preoxygenation: BVM Sedatives: Etomidate Paralytic: Rocuronium Tube Size: 7.5 cuffed Post-procedure assessment: chest rise and ETCO2 monitor Breath sounds: equal and absent over the epigastrium Tube secured by Respiratory Therapy Patient tolerated the procedure well with no immediate complications.  CRITICAL CARE Performed by: Scotty Court, Maylani Embree Total critical care time: 40 Critical care time was exclusive of separately billable procedures and treating other patients. Critical care was necessary to treat or prevent imminent or life-threatening deterioration. Critical care was time spent personally by me on the following activities: development of treatment plan with patient and/or surrogate as well as nursing, discussions with consultants, evaluation of patient's response to treatment, examination of patient, obtaining history from patient or surrogate, ordering and performing treatments and interventions, ordering and review of laboratory studies, ordering and review of radiographic studies, pulse oximetry and re-evaluation of patient's condition.  Cardiopulmonary Resuscitation (CPR) Procedure Note  Directed/Performed by: Sharman Cheek I personally directed ancillary staff and/or performed CPR in an effort to regain return of spontaneous circulation and to maintain cardiac, neuro and systemic perfusion.  CPR performed personally by me and by staff under my direction.  External cardiac pacing During ACLS, at one point patient was bradycardic with intact blood pressure.  External cardiac pacing was started in attempt to prevent recurrent  arrest.  This was continued approximately 3 minutes before the patient was found to be in PEA again.  .Cardioversion Date/Time: 05/13/2018 6:14 PM Performed by: Sharman Cheek, MD Authorized by: Sharman Cheek, MD   Consent:    Consent obtained:  Emergent situation Pre-procedure details:    Cardioversion basis:  Emergent   Pre-procedure rhythm: Ventricular fibrillation.   Electrode placement:  Anterior-posterior Patient sedated: No Attempt one:    Cardioversion mode:  Asynchronous   Waveform:  Biphasic   Shock (Joules):  200   Shock outcome:  Conversion to normal sinus rhythm Post-procedure details:    Patient status:  Unresponsive   Patient tolerance of procedure:  Tolerated well, no immediate complications Comments:     In the midst of ACLS, on rhythm check at one point the patient was found to be in ventricular fibrillation, was defibrillated with conversion to sinus rhythm.        Medical Decision making  Patient found to be in cardiac arrest, predominantly PEA related to bradycardia.  See code flow sheet for more details.  Patient had epinephrine and CPR, external pacing, intubated, an episode of ventricular fibrillation that was  electrically defibrillated, and received an amiodarone 150 mg bolus.  Diltiazem and heparin were discontinued during the initial cardiac arrest.  Assessment and Plan  Transported to ICU for further care by intensivist.    After transport to the ICU, I return to the emergency department.  However, unfortunately about 10 minutes later the patient had another cardiac arrest.  Return to the bedside in the ICU, patient was receiving CPR and had also had 2 doses of epinephrine and 1 dose of atropine.  On pulse check he was bradycardic with a heart rate of 50 but pulse was present but thready, and the automated blood pressure cuff was unable to obtain a reading.  Instructed staff to start norepinephrine infusion for his hypotension.  Intensivist  then arrived to the bedside at night turned over care to her.      Sharman Cheek, MD 05-29-2018 Rickey Primus

## 2018-06-04 NOTE — Consult Note (Signed)
 [facility] Cardiology Consultation Note  Patient ID: Mario Diaz, MRN: [id number], DOB/AGE: Oct 16, 1955 63 y.o. Admit date: 06/03/2018   Date of Consult: 05/11/2018 Primary Physician: [facility] Primary Cardiologist: Gwen Pounds  Chief Complaint:  Chief Complaint  Patient presents with  . Shortness of Breath   Reason for Consult: Redness of breath atrial flutter  HPI: 62 y.o. male with known coronary artery disease status post previous coronary artery bypass graft essential hypertension mixed hyperlipidemia and COPD due to significant tobacco abuse which he continues to smoke.  The patient has had mild dilated cardiomyopathy possibly multifactorial in nature including atrial flutter in the past with no evidence of significant valvular heart disease.  Patient had progression of symptoms of severe shortness of breath cough and congestion as well as rapid palpitations.  When seen in the emergency room he had atrial flutter with rapid ventricular rate of 150 bpm.  Medication management has slowed him down to 100 bpm off and on and he still has some wheezing and shortness of breath.  Troponin is 0.04 consistent with demand ischemia and no current evidence of myocardial infarction.  He does have some hypoxia from COPD.  Currently chest x-ray suggest no evidence of congestive heart failure or pulmonary edema but does suggest a pleural effusion and emphysema patient is slightly improved at this time  Past Medical History:  Diagnosis Date  . Asthma   . COPD (chronic obstructive pulmonary disease) (HCC)   . Coronary artery disease   . Hypertension   . Ischemic cardiomyopathy       Surgical History:  Past Surgical History:  Procedure Laterality Date  . CARDIAC SURGERY     double bipass  . CORONARY ARTERY BYPASS GRAFT       Home Meds: Prior to Admission medications   Medication Sig Start Date End Date Taking? Authorizing Provider  acetaminophen (TYLENOL) 500 MG tablet  Take 1-2 tablets by mouth every 6 (six) hours as needed for mild pain or headache.  02/05/15  Yes [provider]  albuterol (PROVENTIL HFA;VENTOLIN HFA) 108 (90 BASE) MCG/ACT inhaler Inhale 2 puffs into the lungs every 6 (six) hours as needed for wheezing or shortness of breath. 05/29/15  Yes Darci Current, MD  aspirin 81 MG tablet Take 1 tablet by mouth daily. 02/05/15  Yes [provider]  fluticasone (FLOVENT HFA) 44 MCG/ACT inhaler Inhale 1-2 puffs into the lungs 2 (two) times daily.   Yes [provider]    Inpatient Medications:  . aspirin  81 mg Oral Daily  . budesonide (PULMICORT) nebulizer solution  0.25 mg Nebulization BID  . guaiFENesin  600 mg Oral BID  . ipratropium  0.5 mg Nebulization Q6H  . levalbuterol  1.25 mg Nebulization Q6H  . methylPREDNISolone (SOLU-MEDROL) injection  60 mg Intravenous Q6H  . metoprolol tartrate  12.5 mg Oral BID  . nicotine  14 mg Transdermal Daily  . sodium chloride flush  3 mL Intravenous Q12H   . sodium chloride    . doxycycline (VIBRAMYCIN) IV Stopped (05/29/2018 0140)  . heparin 1,100 Units/hr (06/02/2018 0700)    Allergies:  Allergies  Allergen Reactions  . Sulfa Antibiotics Rash    Social History   Socioeconomic History  . Marital status: Divorced    Spouse name: Not on file  . Number of children: Not on file  . Years of education: Not on file  . Highest education level: Not on file  Occupational History  . Not on  file  Social Needs  . Financial resource strain: Not on file  . Food insecurity:    Worry: Not on file    Inability: Not on file  . Transportation needs:    Medical: Not on file    Non-medical: Not on file  Tobacco Use  . Smoking status: Current Every Day Smoker    Types: Cigarettes  . Smokeless tobacco: Never Used  Substance and Sexual Activity  . Alcohol use: Yes  . Drug use: No  . Sexual activity: Never  Lifestyle  . Physical activity:    Days per week: Not on file    Minutes  per session: Not on file  . Stress: Not on file  Relationships  . Social connections:    Talks on phone: Not on file    Gets together: Not on file    Attends religious service: Not on file    Active member of club or organization: Not on file    Attends meetings of clubs or organizations: Not on file    Relationship status: Not on file  . Intimate partner violence:    Fear of current or ex partner: Not on file    Emotionally abused: Not on file    Physically abused: Not on file    Forced sexual activity: Not on file  Other Topics Concern  . Not on file  Social History Narrative  . Not on file     Family History  Problem Relation Age of Onset  . CAD Mother   . CAD Father   . Cancer Sister      Review of Systems Positive for shortness of breath palpitations Negative for: General:  chills, fever, night sweats or weight changes.  Cardiovascular: PND orthopnea syncope dizziness  Dermatological skin lesions rashes Respiratory: Cough congestion Urologic: Frequent urination urination at night and hematuria Abdominal: negative for nausea, vomiting, diarrhea, bright red blood per rectum, melena, or hematemesis Neurologic: negative for visual changes, and/or hearing changes  All other systems reviewed and are otherwise negative except as noted above.  Labs: Recent Labs    05/24/2018 1825 05/04/2018 2253 May 25, 2018 0421  TROPONINI 0.06* 0.05* 0.04*   Lab Results  Component Value Date   WBC 8.8 05/30/2018   HGB 15.5 06/01/2018   HCT 47.6 05/09/2018   MCV 96.9 05/16/2018   PLT 211 05/15/2018    Recent Labs  Lab 05-25-18 0421  NA 133*  K 5.2*  CL 98  CO2 25  BUN 15  CREATININE 0.90  CALCIUM 8.6*  PROT 6.8  BILITOT 0.7  ALKPHOS 62  ALT 79*  AST 55*  GLUCOSE 166*   Lab Results  Component Value Date   CHOL 176 01/19/2013   HDL 33 (L) 01/19/2013   LDLCALC 122 (H) 01/19/2013   TRIG 105 01/19/2013   No results found for: DDIMER  Radiology/Studies:  Ct Angio  Chest Pe W And/or Wo Contrast  Result Date: 06/03/2018 CLINICAL DATA:  Chest pain or shortness of breath.  Pleurisy EXAM: CT ANGIOGRAPHY CHEST WITH CONTRAST TECHNIQUE: Multidetector CT imaging of the chest was performed using the standard protocol during bolus administration of intravenous contrast. Multiplanar CT image reconstructions and MIPs were obtained to evaluate the vascular anatomy. CONTRAST:  75mL ISOVUE-370 IOPAMIDOL (ISOVUE-370) INJECTION 76% COMPARISON:  03/23/2013 FINDINGS: Cardiovascular: Negative for pulmonary embolism. Cardiomegaly. No pericardial effusion. Extensive atherosclerosis of the aorta and coronaries. Status post CABG Mediastinum/Nodes: Negative for adenopathy Lungs/Pleura: Emphysema and generalized airway thickening. Small right pleural effusion with  mild right base atelectasis. Subpleural density in the left upper lobe measuring 12 mm, just below a sternotomy, mildly increased from 2014 when it measured 10 mm. There is also an irregularly-shaped nodular density in the subpleural left upper lobe measuring 18 x 8 mm that is new from 2014. Upper Abdomen: Partially covered left renal cystic density. Musculoskeletal: Nonunited sternotomy. No acute or destructive finding. Review of the MIP images confirms the above findings. IMPRESSION: 1. Negative for pulmonary embolism. 2. Small right pleural effusion with mild atelectasis. 3. Two nodules in the left upper lobe measuring up to 18 x 8 mm-new/progressive from 2014. Consider one of the following in 3 months for both low-risk and high-risk individuals: (a) repeat chest CT, (b) follow-up PET-CT, or (c) tissue sampling. This recommendation follows the consensus statement: Guidelines for Management of Incidental Pulmonary Nodules Detected on CT Images: From the Fleischner Society 2017; Radiology 2017; 284:228-243. 4. Chronic nonunion of sternotomy. 5. Cardiomegaly. 6. Emphysema. Electronically Signed   By: Marnee Spring M.D.   On: 05/05/2018  19:20   Dg Chest Port 1 View  Result Date: 05/14/2018 CLINICAL DATA:  Worsening dyspnea for 2-3 days. EXAM: PORTABLE CHEST 1 VIEW COMPARISON:  05/29/2015 FINDINGS: Cardiomegaly with aortic atherosclerosis. Emphysematous hyperinflation of the lungs, upper lobe predominant is redemonstrated with bibasilar right greater than left atelectasis. No overt pulmonary edema or pneumothorax. Median sternotomy sutures are noted similar in appearance. ACDF of the lower included cervical spine. IMPRESSION: Cardiomegaly with aortic atherosclerosis and COPD. Bibasilar right greater than left atelectasis is identified. Electronically Signed   By: Tollie Eth M.D.   On: 05/10/2018 18:43   US Abdomen Limited Ruq  Result Date:  CLINICAL DATA:  Transaminitis. EXAM: ULTRASOUND ABDOMEN LIMITED RIGHT UPPER QUADRANT COMPARISON:  Chest CT earlier this day. FINDINGS: Gallbladder: Physiologically distended with diffuse gallbladder wall thickening of 9 mm. Possible trace pericholecystic fluid. No gallstones. No significant intraluminal sludge. No sonographic Murphy sign noted by sonographer. Common bile duct: Diameter: 2-3 mm, normal. Liver: No focal lesion identified. Within normal limits in parenchymal echogenicity. Portal vein is patent on color Doppler imaging with normal direction of blood flow towards the liver. Incidental right pleural effusion. IMPRESSION: 1. Diffuse gallbladder wall thickening with possible trace pericholecystic fluid. No gallstones or sonographic Murphy sign. Gallbladder findings may be secondary to hepatic disease or passive hepatic congestion versus acalculous cholecystitis. 2. Normal sonographic appearance of the liver. No biliary dilatation. Electronically Signed   By: Narda Rutherford M.D.   On: 05/28/2018 22:12    EKG: Atrial flutter with rapid ventricular rate and preventricular contractions consistent with atrial flutter  Weights: Filed Weights   05/15/2018 1722 05/18/2018 1814 05/25/2018  2219  Weight: 68 kg 72.6 kg 74.7 kg     Physical Exam: Blood pressure (!) 112/93, pulse (!) 128, temperature 97.7 F (36.5 C), temperature source Oral, resp. rate 18, height 5\' 8"  (1.727 m), weight 74.7 kg, SpO2 96 %. Body mass index is 25.03 kg/m. General: Well developed, well nourished, in no acute distress. Head eyes ears nose throat: Normocephalic, atraumatic, sclera non-icteric, no xanthomas, nares are without discharge. No apparent thyromegaly and/or mass  Lungs: Normal respiratory effort.  Few's wheezes, no rales, some rhonchi.  Heart: With normal S1 S2. no murmur gallop, no rub, PMI is normal size and placement, carotid upstroke normal without bruit, jugular venous pressure is normal Abdomen: Soft, non-tender, non-distended with normoactive bowel sounds. No hepatomegaly. No rebound/guarding. No obvious abdominal masses. Abdominal aorta is normal size without  bruit Extremities: No edema. no cyanosis, no clubbing, no ulcers  Peripheral : 2+ bilateral upper extremity pulses, 2+ bilateral femoral pulses, 2+ bilateral dorsal pedal pulse Neuro: Alert and oriented. No facial asymmetry. No focal deficit. Moves all extremities spontaneously. Musculoskeletal: Normal muscle tone without kyphosis Psych:  Responds to questions appropriately with a normal affect.    Assessment: 62 year old male with paroxysmal nonvalvular atrial flutter with rapid ventricular rate exacerbation COPD and shortness of breath without evidence of myocardial infarction congestive heart failure  Plan: 1.  Continue medication management for heart rate control including metoprolol and/or diltiazem for heart rate below 100 bpm 2.  Anticoagulation for further risk reduction in stroke with atrial fibrillation 3.  Serial ECG and enzymes to assess for myocardial infarction 4.  Treatment of emphysema and/or COPD exacerbation 5.  Further consideration of electrical cardioversion to normal sinus rhythm if patient does not  spontaneously convert to normal sinus rhythm with possible transesophageal echocardiogram as well due to unknown length of rhythm disturbance.  Patient understands risk and benefits of transesophageal echocardiogram as well as cardioversion including stroke heart attack esophageal perforation and side effects of medication management.  Signed, Lamar Blinks M.D. [facility] Cardiology 05/08/2018, 8:43 AM

## 2018-06-04 NOTE — Progress Notes (Addendum)
CCMD called to report that pts HR had dropped to 30s - not sustaining - but remained brady in 50s/ RN to bedside to assess/ pt was sleeping/ easy to arouse/ HR 54/ pt stated he needed to use bathroom / encouraged pt  To wait until NS bolus finished to avoid dizziness/ verbalized an understanding/ CCMD called again to report pt HR was sustaing in 30s/ RN to bedside/ pt blue and unresponsive/ agonal breathing noted/ faint pulse felt initially Eliseo Squires fixed/ CODE BLUE called/ CPR started/ pt intubated by ER MD/ Dr Elpidio Anis paged to make aware/ transported to CCU

## 2018-06-04 NOTE — Progress Notes (Signed)
PATIENT NAME: Mario Diaz MEDICAL RECORD NUMBER: [id number] Birthday: 01-14-1956  Age: 62 y.o. Admit Date: 2018/06/06  Indication: Cardiac arrest initial event was bradycardia and diagonal breathing. Patient had been hypotensive most of the day. Admitting diagnosis was atrial fibrillation with RVR.  Code blue was called at 1728 hrs., the patient received atropine initially. High quality CPR was started. Patient was intubated 1738 hours by ED physician. Patient had developed issues with ventricular fibrillation was cardioverter did times one. Following that he had PA. Patient subsequently was brought to the intensive care unit I took over the code 1803. Patient had 2 L total normal saline during code occasions provided were as noted below. He did not show any other issues with VTACH or V. fib after initial cardioversion. He however continue to exhibit periods of PEA. He then would become bradycardic alternating with tachycardia. In hypotensive throughout despite pressors. High-quality CPR was provided throughout. Arterial blood gases showed acidosis combined respiratory and metabolic as well as low calcium. Patient received bicarb and calcium chloride in response to these numbers.  Technical Description:   CPR performance duration: 90 minutes  Was defibrillation or cardioversion used ? X1  Was external pacer placed ? No  Was patient intubated pre/post CPR ? Patient was intubated at 1738 hrs.  Was transvenous pacer placed ? No  Medications Administered Include      Yes/no Amiodarone No  Atropine 3  Calcium 1  Epinephrine 9  Lidocaine No  Magnesium No  Norepinephrine Y  Phenylephrine No  Sodium bicarbonate 2  Vasopression No   Evaluation  Final Status - Was patient successfully resuscitated ? No  If successfully resuscitated - what is current rhythm ?N/A If successfully resuscitated - what is current hemodynamic status ? N/A  Miscellaneous Information Code was called at 1840  hrs. It was unsuccessful. Patient's family present and situation was explained. Of note during the code point-of-care echocardiogram was done that showed initial a kinesis of the inferior wall and subsequently augment of the lateral wall as well. Suspect the patient had massive MI.  Gailen Shelter, MD [facility]  10/19/20196:51 PM

## 2018-06-04 NOTE — Progress Notes (Signed)
ANTICOAGULATION CONSULT NOTE  Pharmacy Consult for heparin drip Indication: atrial fibrillation  Allergies  Allergen Reactions  . Sulfa Antibiotics Rash    Patient Measurements: Height: 5\' 8"  (172.7 cm) Weight: 164 lb 9.6 oz (74.7 kg) IBW/kg (Calculated) : 68.4 Heparin Dosing Weight: 75 kg  Vital Signs: Temp: 97.7 F (36.5 C) (10/19 0816) Temp Source: Oral (10/19 0816) BP: 120/90 (10/19 0900) Pulse Rate: 128 (10/19 0816)  Labs: Recent Labs    06/02/2018 1738  05/17/2018 1833 05/10/2018 2253 04-Jun-2018 0421 06-04-18 1102  HGB 15.5  --   --   --   --   --   HCT 47.6  --   --   --   --   --   PLT 211  --   --   --   --   --   APTT  --   --  31  --   --   --   LABPROT  --   --  13.9  --   --   --   INR  --   --  1.08  --   --   --   HEPARINUNFRC  --   --   --   --  0.32 0.29*  CREATININE 0.83  --   --   --  0.90  --   TROPONINI  --    < >  --  0.05* 0.04* 0.04*   < > = values in this interval not displayed.    Estimated Creatinine Clearance: 82.3 mL/min (by C-G formula based on SCr of 0.9 mg/dL).   Medical History: Past Medical History:  Diagnosis Date  . Asthma   . COPD (chronic obstructive pulmonary disease) (HCC)   . Coronary artery disease   . Hypertension   . Ischemic cardiomyopathy     Medications:  No anticoagulation in PTA meds  Assessment: Heparin 1100 units/hr  10/19 ~ 11:00  HL = 0.29   Goal of Therapy:  Heparin level 0.3-0.7 units/ml Monitor platelets by anticoagulation protocol: Yes   Plan:  Ordered a bolus of 1100 units and increased drip rate to 1250 units/hr.   Check HL tonight at 19:00.   Pharmacy to monitor and adjust as per protocol.   Jashae Wiggs K, RPH 06-04-2018,12:51 PM

## 2018-06-04 NOTE — Progress Notes (Signed)
ANTICOAGULATION CONSULT NOTE - Initial Consult  Pharmacy Consult for heparin drip Indication: atrial fibrillation  Allergies  Allergen Reactions  . Sulfa Antibiotics Rash    Patient Measurements: Height: 5\' 8"  (172.7 cm) Weight: 164 lb 9.6 oz (74.7 kg) IBW/kg (Calculated) : 68.4 Heparin Dosing Weight: 75 kg  Vital Signs: Temp: 98 F (36.7 C) (10/19 0325) Temp Source: Oral (10/19 0325) BP: 99/73 (10/19 0325) Pulse Rate: 83 (10/19 0325)  Labs: Recent Labs    May 27, 2018 1738 2018-05-27 1825 05/27/2018 1833 27-May-2018 2253 05/25/2018 0421  HGB 15.5  --   --   --   --   HCT 47.6  --   --   --   --   PLT 211  --   --   --   --   APTT  --   --  31  --   --   LABPROT  --   --  13.9  --   --   INR  --   --  1.08  --   --   HEPARINUNFRC  --   --   --   --  0.32  CREATININE 0.83  --   --   --  0.90  TROPONINI  --  0.06*  --  0.05* 0.04*    Estimated Creatinine Clearance: 82.3 mL/min (by C-G formula based on SCr of 0.9 mg/dL).   Medical History: Past Medical History:  Diagnosis Date  . Asthma   . COPD (chronic obstructive pulmonary disease) (HCC)   . Coronary artery disease   . Hypertension   . Ischemic cardiomyopathy     Medications:  No anticoagulation in PTA meds  Assessment: Trop 0.06  Goal of Therapy:  Heparin level 0.3-0.7 units/ml Monitor platelets by anticoagulation protocol: Yes   Plan:  4500 unit bolus and initial rate of 1100 units/hr. First heparin level 6 hours after start of infusion  10/19 0400 heparin level 0.32. Continue current regimen. Recheck in 6 hours to confirm.  Latravis Grine S 05/16/2018,6:46 AM

## 2018-06-04 NOTE — Progress Notes (Signed)
   05/28/2018 1725  Clinical Encounter Type  Visited With Patient not available;Health care provider  Visit Type Initial;Spiritual support;Code  Referral From Nurse  Consult/Referral To Chaplain  Spiritual Encounters  Spiritual Needs Prayer;Other (Comment)   [facility] received a Code Blue page for Mario Diaz. While the care team treated the patient I provided a pastoral presences and prayed silently for the patient. I will continue to follow up as needed.

## 2018-06-04 NOTE — Progress Notes (Addendum)
MD made aware of low BP/ pt asymptomatic/ order to recheck BP in 1 hour/will continue to monitor

## 2018-06-04 NOTE — Progress Notes (Signed)
   2018-06-21 1835  Clinical Encounter Type  Visited With Family;Patient not available  Visit Type Follow-up;Spiritual support;Death  Referral From Nurse;Physician  Consult/Referral To Chaplain  Spiritual Encounters  Spiritual Needs Emotional;Grief support   While following up on Mario Diaz, I encountered the MD talking with the family about Mario Diaz prognosis. I maintained a pastoral presence with the family as they grieved. I then escorted the family back to the patient as he was pronounced decreased. I provided comfort and active spiritual support. I will continue to follow up with the family.

## 2018-06-04 NOTE — Progress Notes (Signed)
Pt arrived to room ICU 9 from 254 after a code was called. Pt went back into PEA arrest right after arrival. Code blue called. See code sheet.

## 2018-06-04 NOTE — Progress Notes (Signed)
Pt blood pressure remains low/ pt remains asymptomatic/ Dr. Elpidio Anis paged to make aware/ ordered to give ns bolus / pt c/o some nausea / meds given/ will monitor close

## 2018-06-04 NOTE — Progress Notes (Signed)
  Amiodarone Drug - Drug Interaction Consult Note  Recommendations: Monitor therapy Amiodarone is metabolized by the cytochrome P450 system and therefore has the potential to cause many drug interactions. Amiodarone has an average plasma half-life of 50 days (range 20 to 100 days).   There is potential for drug interactions to occur several weeks or months after stopping treatment and the onset of drug interactions may be slow after initiating amiodarone.   []  Statins: Increased risk of myopathy. Simvastatin- restrict dose to 20mg  daily. Other statins: counsel patients to report any muscle pain or weakness immediately.  []  Anticoagulants: Amiodarone can increase anticoagulant effect. Consider warfarin dose reduction. Patients should be monitored closely and the dose of anticoagulant altered accordingly, remembering that amiodarone levels take several weeks to stabilize.  []  Antiepileptics: Amiodarone can increase plasma concentration of phenytoin, the dose should be reduced. Note that small changes in phenytoin dose can result in large changes in levels. Monitor patient and counsel on signs of toxicity.  [x]  Beta blockers: increased risk of bradycardia, AV block and myocardial depression. Sotalol - avoid concomitant use.  []   Calcium channel blockers (diltiazem and verapamil): increased risk of bradycardia, AV block and myocardial depression.  []   Cyclosporine: Amiodarone increases levels of cyclosporine. Reduced dose of cyclosporine is recommended.  []  Digoxin dose should be halved when amiodarone is started.  []  Diuretics: increased risk of cardiotoxicity if hypokalemia occurs.  []  Oral hypoglycemic agents (glyburide, glipizide, glimepiride): increased risk of hypoglycemia. Patient's glucose levels should be monitored closely when initiating amiodarone therapy.   [x]  Drugs that prolong the QT interval:  Torsades de pointes risk may be increased with concurrent use - avoid if possible.   Monitor QTc, also keep magnesium/potassium WNL if concurrent therapy can't be avoided.   Ondansetron . Antibiotics: e.g. fluoroquinolones, erythromycin. . Antiarrhythmics: e.g. quinidine, procainamide, disopyramide, sotalol. . Antipsychotics: e.g. phenothiazines, haloperidol.  . Lithium, tricyclic antidepressants, and methadone. Thank You,  Melissa Tomaselli S  05/31/18 6:14 AM

## 2018-06-04 NOTE — Progress Notes (Addendum)
[employer] [facility] - [place] [facility]   PATIENT NAME: Mario Diaz    MR#:  [id number]  DATE OF BIRTH:  1956/03/21  SUBJECTIVE:  CHIEF COMPLAINT:   Chief Complaint  Patient presents with  . Shortness of Breath   CODE BLUE  Patient was initially hypotensive and became very bradycardic and eventually unresponsive.  CODE BLUE was called, initially patient was in PEA ,ACLS protocol implemented, transcutaneous pacing was done and intubated and subsequently patient had a V. tach and V. fib , he was shocked and rhythm converted back to normal sinus rhythm but as he was having multiple PVCs and nonsustained V. tach amiodarone 50 bolus was given transferred to intensive care unit  REVIEW OF SYSTEMS:    Review of systems unobtainable as the patient is unresponsive  DRUG ALLERGIES:   Allergies  Allergen Reactions  . Sulfa Antibiotics Rash    VITALS:  Blood pressure (!) 169/110, pulse (!) 59, temperature 97.7 F (36.5 C), temperature source Oral, resp. rate 18, height 5\' 8"  (1.727 m), weight 74.7 kg, SpO2 92 %.  PHYSICAL EXAMINATION:   Physical Exam-Limited as patient was seen during CODE BLUE  GENERAL:  62 y.o.-year-old patient lying in the bed with resp distress EYES: Pupils left-sided gaze  HEENT: Head atraumatic, normocephalic. Oropharynx ET tube NECK:  Supple, no jugular venous distention.  LUNGS: Moderate breath sounds CARDIOVASCULAR: Irregularly irregular  ABDOMEN: Soft, nontender, Bowel sounds present EXTREMITIES: No clubbing or edema b/l.    NEUROLOGIC: Altered    LABORATORY PANEL:   CBC Recent Labs  Lab 2018/06/16 1738  WBC 8.8  HGB 15.5  HCT 47.6  PLT 211   ------------------------------------------------------------------------------------------------------------------ Chemistries  Recent Labs  Lab 05/14/2018 0421  NA 133*  K 5.2*  CL 98  CO2 25  GLUCOSE 166*  BUN 15  CREATININE 0.90  CALCIUM 8.6*  MG 2.1  AST 55*  ALT 79*  ALKPHOS 62   BILITOT 0.7   ------------------------------------------------------------------------------------------------------------------  Cardiac Enzymes Recent Labs  Lab 05/20/2018 1102  TROPONINI 0.04*   ------------------------------------------------------------------------------------------------------------------  RADIOLOGY:  Ct Angio Chest Pe W And/or Wo Contrast  Result Date: 2018-06-16 CLINICAL DATA:  Chest pain or shortness of breath.  Pleurisy EXAM: CT ANGIOGRAPHY CHEST WITH CONTRAST TECHNIQUE: Multidetector CT imaging of the chest was performed using the standard protocol during bolus administration of intravenous contrast. Multiplanar CT image reconstructions and MIPs were obtained to evaluate the vascular anatomy. CONTRAST:  75mL ISOVUE-370 IOPAMIDOL (ISOVUE-370) INJECTION 76% COMPARISON:  03/23/2013 FINDINGS: Cardiovascular: Negative for pulmonary embolism. Cardiomegaly. No pericardial effusion. Extensive atherosclerosis of the aorta and coronaries. Status post CABG Mediastinum/Nodes: Negative for adenopathy Lungs/Pleura: Emphysema and generalized airway thickening. Small right pleural effusion with mild right base atelectasis. Subpleural density in the left upper lobe measuring 12 mm, just below a sternotomy, mildly increased from 2014 when it measured 10 mm. There is also an irregularly-shaped nodular density in the subpleural left upper lobe measuring 18 x 8 mm that is new from 2014. Upper Abdomen: Partially covered left renal cystic density. Musculoskeletal: Nonunited sternotomy. No acute or destructive finding. Review of the MIP images confirms the above findings. IMPRESSION: 1. Negative for pulmonary embolism. 2. Small right pleural effusion with mild atelectasis. 3. Two nodules in the left upper lobe measuring up to 18 x 8 mm-new/progressive from 2014. Consider one of the following in 3 months for both low-risk and high-risk individuals: (a) repeat chest CT, (b) follow-up PET-CT, or (c)  tissue sampling. This recommendation follows the consensus  statement: Guidelines for Management of Incidental Pulmonary Nodules Detected on CT Images: From the Fleischner Society 2017; Radiology 2017; 284:228-243. 4. Chronic nonunion of sternotomy. 5. Cardiomegaly. 6. Emphysema. Electronically Signed   By: Marnee Spring M.D.   On: 06/18/2018 19:20   Dg Chest Port 1 View  Result Date: 2018-06-18 CLINICAL DATA:  Worsening dyspnea for 2-3 days. EXAM: PORTABLE CHEST 1 VIEW COMPARISON:  05/29/2015 FINDINGS: Cardiomegaly with aortic atherosclerosis. Emphysematous hyperinflation of the lungs, upper lobe predominant is redemonstrated with bibasilar right greater than left atelectasis. No overt pulmonary edema or pneumothorax. Median sternotomy sutures are noted similar in appearance. ACDF of the lower included cervical spine. IMPRESSION: Cardiomegaly with aortic atherosclerosis and COPD. Bibasilar right greater than left atelectasis is identified. Electronically Signed   By: Tollie Eth M.D.   On: 2018-06-18 18:43   US Abdomen Limited Ruq  Result Date: 06-18-2018 CLINICAL DATA:  Transaminitis. EXAM: ULTRASOUND ABDOMEN LIMITED RIGHT UPPER QUADRANT COMPARISON:  Chest CT earlier this day. FINDINGS: Gallbladder: Physiologically distended with diffuse gallbladder wall thickening of 9 mm. Possible trace pericholecystic fluid. No gallstones. No significant intraluminal sludge. No sonographic Murphy sign noted by sonographer. Common bile duct: Diameter: 2-3 mm, normal. Liver: No focal lesion identified. Within normal limits in parenchymal echogenicity. Portal vein is patent on color Doppler imaging with normal direction of blood flow towards the liver. Incidental right pleural effusion. IMPRESSION: 1. Diffuse gallbladder wall thickening with possible trace pericholecystic fluid. No gallstones or sonographic Murphy sign. Gallbladder findings may be secondary to hepatic disease or passive hepatic congestion versus  acalculous cholecystitis. 2. Normal sonographic appearance of the liver. No biliary dilatation. Electronically Signed   By: Narda Rutherford M.D.   On: Jun 18, 2018 22:12     ASSESSMENT AND PLAN:   #CODE BLUE with PEA subsequently with V. tach and of atrial fibrillation  ACLS protocol implemented by the ED physician Patient was intubated Stat call was placed to intensivist and discussed with Dr. Jayme Cloud Discussed with cardiology Dr. Gwen Pounds  #Severe bradycardia with hypotension Status post transcutaneous pacing by ED physician  #Severe hypotension IV fluid boluses given Pressors have started  #Acute copd exacerbation with acute hypoxic resp failure -IV steroids, Antibiotics - Scheduled Nebulizers  #Afib with RVR due to COPD and resp failure On metoprolol Heparin drip discontinued as it was concern for stroke and stat CT head ordered  Patient with a high mortality and morbidity and with poor prognosis   All the records are reviewed and case discussed with the patient's daughter who is the healthcare power of attorney after initial CODE BLUE, she is on her way to the hospital Management plans discussed with the patient, family and they are in agreement.  CODE STATUS: FULL CODE  TOTAL CRITICAL CARE TIME TAKING CARE OF THIS PATIENT assisting ED physician:app 60 minutes.    Ramonita Lab M.D on 05/31/2018 at 9:22 PM  Between 7am to 6pm - Pager - [phone]  After 6pm go to [email or web address] - password [email or web address]  [facility] [place] [facility]  Office  [phone]  CC: Primary care physician; Center, [facility]  Note: This dictation was prepared with Dragon dictation along with smaller phrase technology. Any transcriptional errors that result from this process are unintentional.

## 2018-06-04 NOTE — Death Summary Note (Signed)
DEATH SUMMARY   Patient Details  Name: Mario Diaz MRN: [id number] DOB: [birth year]  Admission/Discharge Information   Admit Date:  05/30/18  Date of Death:  2018/05/31  Time of Death:  1840  Length of Stay: 1  Referring Physician: [facility], [facility]   Reason(s) for Hospitalization  Atrial fibrillation with rapid ventricular response  Acute exacerbation of COPD  Diagnoses  Preliminary cause of death:  Cardiac arrest suspect due to myocardial infarction Secondary Diagnoses (including complications and co-morbidities):  Active Problems:   Atrial fibrillation and flutter [facility])   Brief Hospital Course (including significant findings, care, treatment, and services provided and events leading to death)  Mario Diaz is a 62 y.o. year old male who was admitted on 10/27/24for atrial fibrillation with rapid ventricular response and suspected acute exacerbation of COPD. I was involved with case for the patient was brought to the intensive care unit in the midst of a code blue situation. The details of the hospital course please refer to the history and physical and progress notes by the hospitalist team. Patient did not survive his clear blue situation. During the code blue a point-of-care echocardiogram was done that showed initial hyper creases of the inferior wall and subsequently showed hyper increases in the inferior wall and lateral wall therefore it is suspect that the patient had acute myocardial infarction as the cause for his arrest.    Pertinent Labs and Studies  Significant Diagnostic Studies Ct Angio Chest Pe W And/or Wo Contrast  Result Date: 2018-05-30 CLINICAL DATA:  Chest pain or shortness of breath.  Pleurisy EXAM: CT ANGIOGRAPHY CHEST WITH CONTRAST TECHNIQUE: Multidetector CT imaging of the chest was performed using the standard protocol during bolus administration of intravenous contrast. Multiplanar CT image reconstructions and MIPs were obtained  to evaluate the vascular anatomy. CONTRAST:  75mL ISOVUE-370 IOPAMIDOL (ISOVUE-370) INJECTION 76% COMPARISON:  03/23/2013 FINDINGS: Cardiovascular: Negative for pulmonary embolism. Cardiomegaly. No pericardial effusion. Extensive atherosclerosis of the aorta and coronaries. Status post CABG Mediastinum/Nodes: Negative for adenopathy Lungs/Pleura: Emphysema and generalized airway thickening. Small right pleural effusion with mild right base atelectasis. Subpleural density in the left upper lobe measuring 12 mm, just below a sternotomy, mildly increased from 2014 when it measured 10 mm. There is also an irregularly-shaped nodular density in the subpleural left upper lobe measuring 18 x 8 mm that is new from 2014. Upper Abdomen: Partially covered left renal cystic density. Musculoskeletal: Nonunited sternotomy. No acute or destructive finding. Review of the MIP images confirms the above findings. IMPRESSION: 1. Negative for pulmonary embolism. 2. Small right pleural effusion with mild atelectasis. 3. Two nodules in the left upper lobe measuring up to 18 x 8 mm-new/progressive from 2014. Consider one of the following in 3 months for both low-risk and high-risk individuals: (a) repeat chest CT, (b) follow-up PET-CT, or (c) tissue sampling. This recommendation follows the consensus statement: Guidelines for Management of Incidental Pulmonary Nodules Detected on CT Images: From the Fleischner Society 2017; Radiology 2017; 284:228-243. 4. Chronic nonunion of sternotomy. 5. Cardiomegaly. 6. Emphysema. Electronically Signed   By: Marnee Spring M.D.   On: 2018/05/30 19:20   Dg Chest Port 1 View  Result Date: 05/30/18 CLINICAL DATA:  Worsening dyspnea for 2-3 days. EXAM: PORTABLE CHEST 1 VIEW COMPARISON:  05/29/2015 FINDINGS: Cardiomegaly with aortic atherosclerosis. Emphysematous hyperinflation of the lungs, upper lobe predominant is redemonstrated with bibasilar right greater than left atelectasis. No overt pulmonary  edema or pneumothorax. Median sternotomy sutures are  noted similar in appearance. ACDF of the lower included cervical spine. IMPRESSION: Cardiomegaly with aortic atherosclerosis and COPD. Bibasilar right greater than left atelectasis is identified. Electronically Signed   By: Tollie Eth M.D.   On: 05/15/2018 18:43   US Abdomen Limited Ruq  Result Date: 05/31/2018 CLINICAL DATA:  Transaminitis. EXAM: ULTRASOUND ABDOMEN LIMITED RIGHT UPPER QUADRANT COMPARISON:  Chest CT earlier this day. FINDINGS: Gallbladder: Physiologically distended with diffuse gallbladder wall thickening of 9 mm. Possible trace pericholecystic fluid. No gallstones. No significant intraluminal sludge. No sonographic Murphy sign noted by sonographer. Common bile duct: Diameter: 2-3 mm, normal. Liver: No focal lesion identified. Within normal limits in parenchymal echogenicity. Portal vein is patent on color Doppler imaging with normal direction of blood flow towards the liver. Incidental right pleural effusion. IMPRESSION: 1. Diffuse gallbladder wall thickening with possible trace pericholecystic fluid. No gallstones or sonographic Murphy sign. Gallbladder findings may be secondary to hepatic disease or passive hepatic congestion versus acalculous cholecystitis. 2. Normal sonographic appearance of the liver. No biliary dilatation. Electronically Signed   By: Narda Rutherford M.D.   On: 05/28/2018 22:12    Microbiology Recent Results (from the past 240 hour(s))  CULTURE, BLOOD (ROUTINE X 2) w Reflex to ID Panel     Status: None (Preliminary result)   Collection Time: 05/26/2018 10:53 PM  Result Value Ref Range Status   Specimen Description BLOOD RIGHT ANTECUBITAL  Final   Special Requests   Final    BOTTLES DRAWN AEROBIC AND ANAEROBIC Blood Culture results may not be optimal due to an excessive volume of blood received in culture bottles   Culture   Final    NO GROWTH < 12 HOURS Performed at [facility], [address], [place], [place] [address]    Report Status PENDING  Incomplete  CULTURE, BLOOD (ROUTINE X 2) w Reflex to ID Panel     Status: None (Preliminary result)   Collection Time: 06/02/2018 10:53 PM  Result Value Ref Range Status   Specimen Description BLOOD LEFT HAND  Final   Special Requests   Final    BOTTLES DRAWN AEROBIC AND ANAEROBIC Blood Culture results may not be optimal due to an excessive volume of blood received in culture bottles   Culture   Final    NO GROWTH < 12 HOURS Performed at [facility], [address] [place], [place] [address]    Report Status PENDING  Incomplete    Lab Basic Metabolic Panel: Recent Labs  Lab 05/17/2018 1738 06-10-2018 0421  NA 134* 133*  K 4.3 5.2*  CL 98 98  CO2 28 25  GLUCOSE 100* 166*  BUN 11 15  CREATININE 0.83 0.90  CALCIUM 8.9 8.6*  MG  --  2.1   Liver Function Tests: Recent Labs  Lab 05/23/2018 1738 2018/06/10 0421  AST 57* 55*  ALT 82* 79*  ALKPHOS 69 62  BILITOT 0.7 0.7  PROT 7.0 6.8  ALBUMIN 3.7 3.5   Recent Labs  Lab 05/24/2018 1738  LIPASE 21   No results for input(s): AMMONIA in the last 168 hours. CBC: Recent Labs  Lab 05/20/2018 1738  WBC 8.8  HGB 15.5  HCT 47.6  MCV 96.9  PLT 211   Cardiac Enzymes: Recent Labs  Lab 05/30/2018 1825 05/26/2018 2253 2018/06/10 0421 06-10-2018 1102  TROPONINI 0.06* 0.05* 0.04* 0.04*   Sepsis Labs: Recent Labs  Lab 05/19/2018 1738  WBC 8.8   Please see admission history and physical and progress  notes. This patient was received into the intensive care unit at the time that he was undergoing a code blue situation. Review of records show that the patient had a history of CABG and that he presented with atrial fibrillation with rapid ventricular response and a COPD exacerbation. Please see code blue note for details. She expired 1840 hrs. after 90 minutes of arrest.     Sarina Ser 05/17/2018, 7:11 PM

## 2018-06-04 DEATH — deceased

## 2019-12-01 IMAGING — US US ABDOMEN LIMITED
1 series · 14 of 25 positions shown · non-contrast
Comparison: Chest CT earlier this day.

CLINICAL DATA: Transaminitis.

EXAM:
ULTRASOUND ABDOMEN LIMITED RIGHT UPPER QUADRANT

[Series 1: us abdomen limited · 14 of 57 slices shown]
[im 1/57]
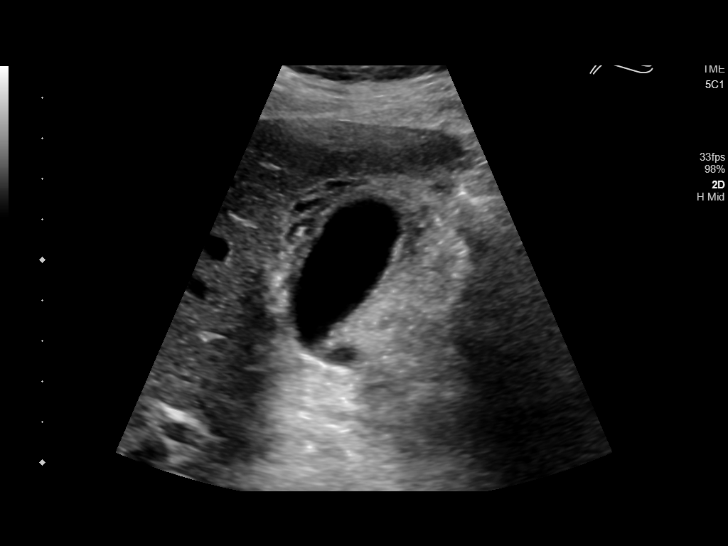
[im 5/57]
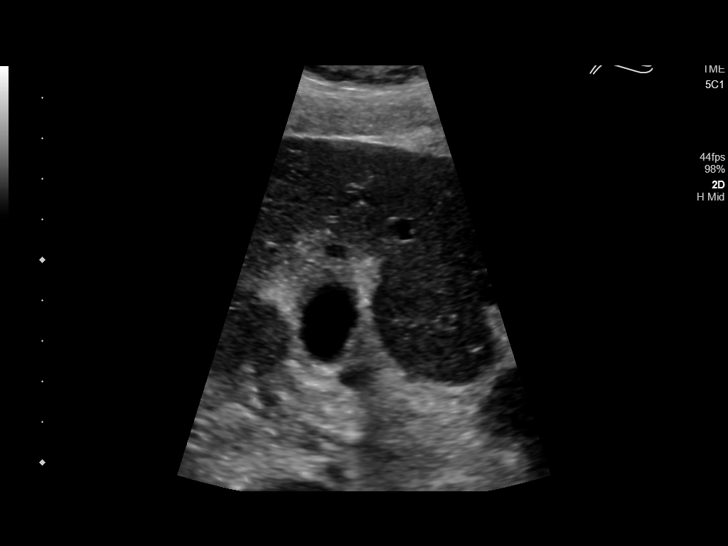
[im 10/57]
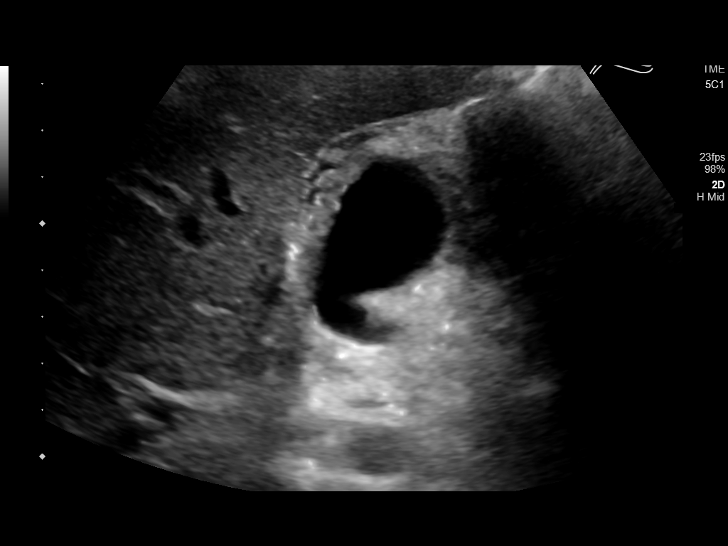
[im 15/57]
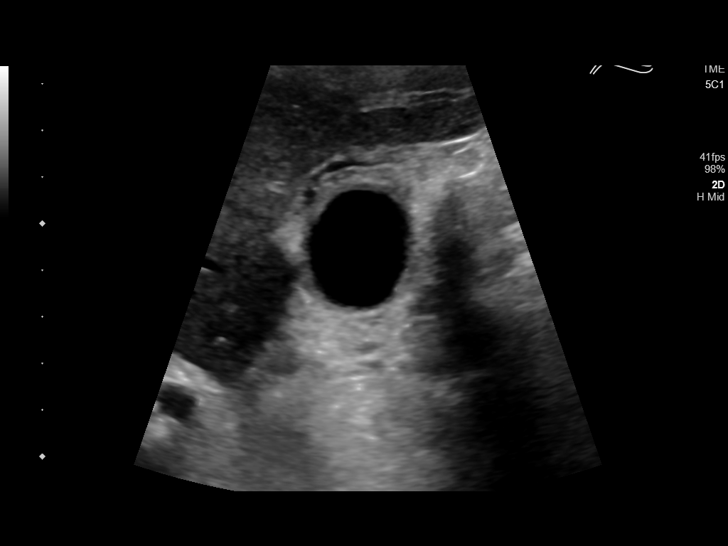
[im 19/57]
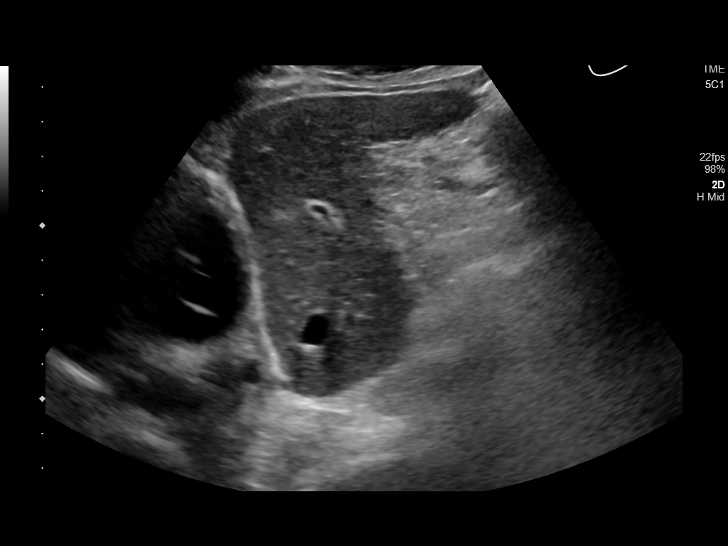
[im 22/57]
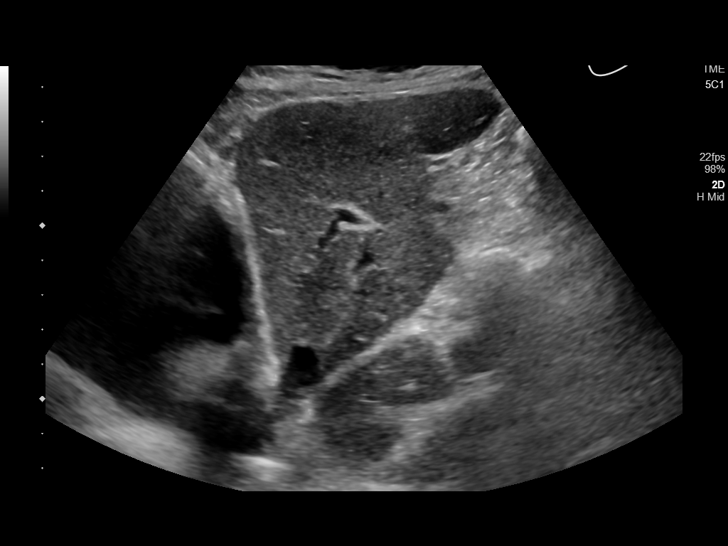
[im 26/57]
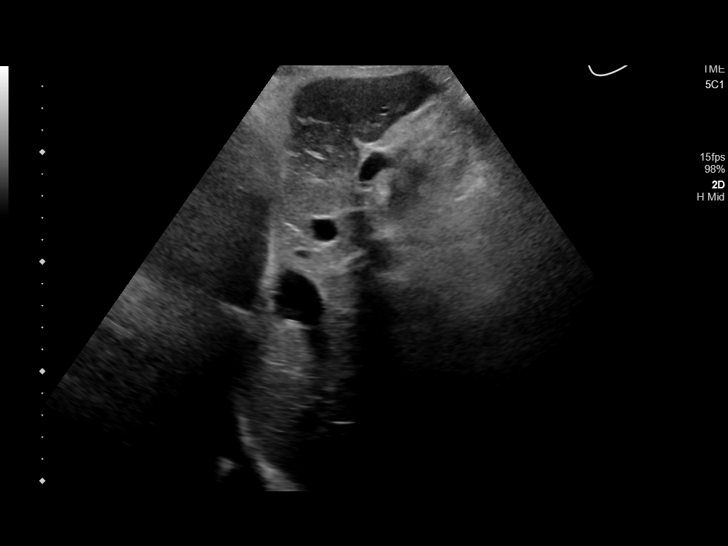
[im 31/57]
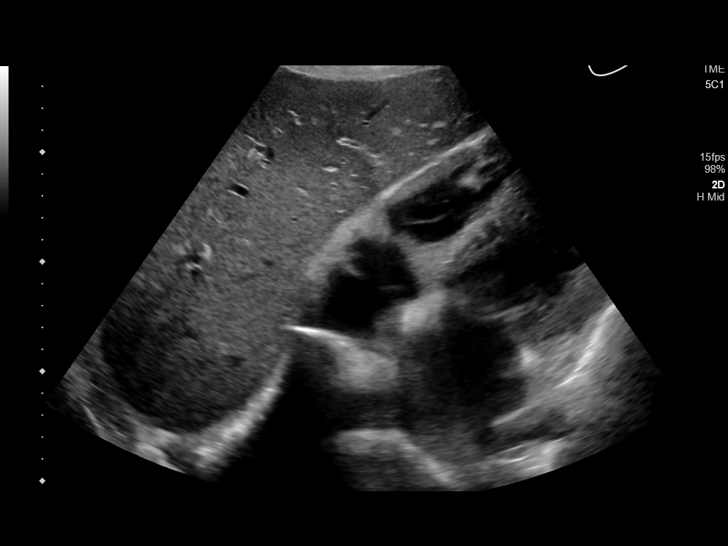
[im 36/57]
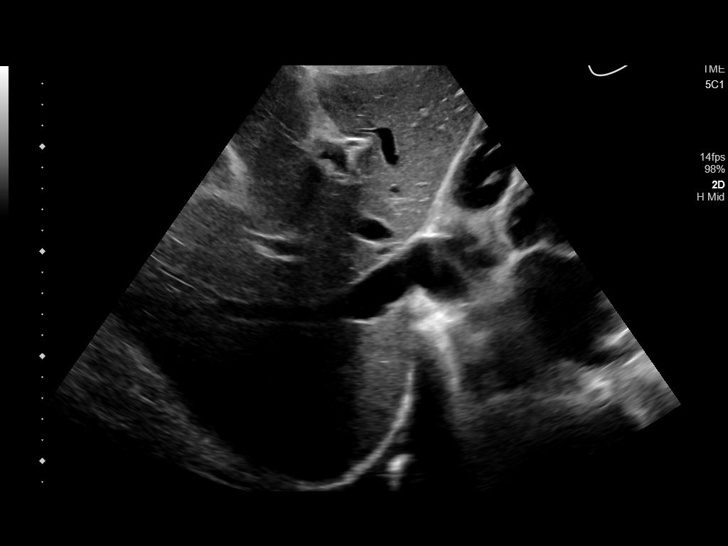
[im 38/57]
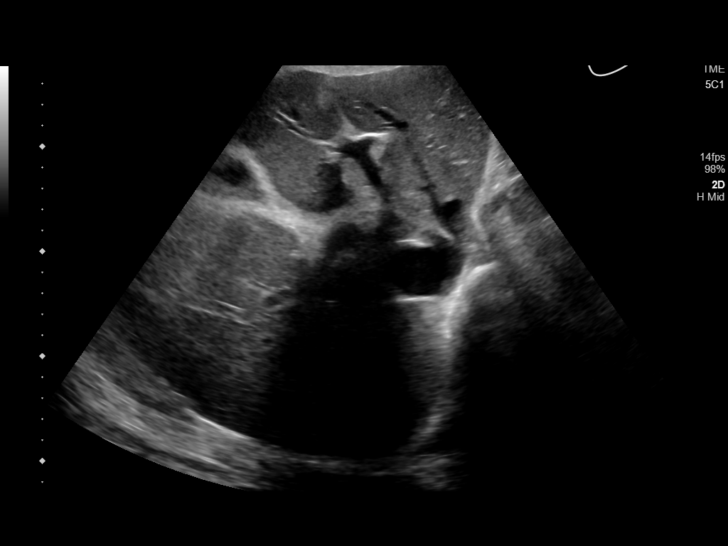
[im 43/57]
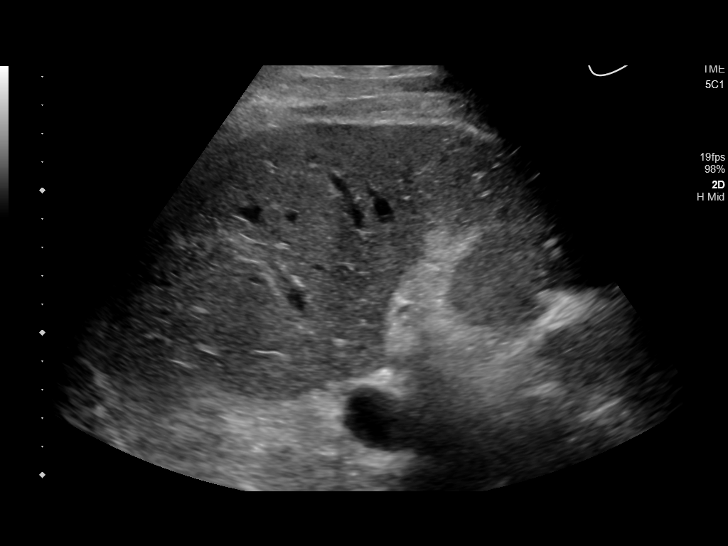
[im 47/57]
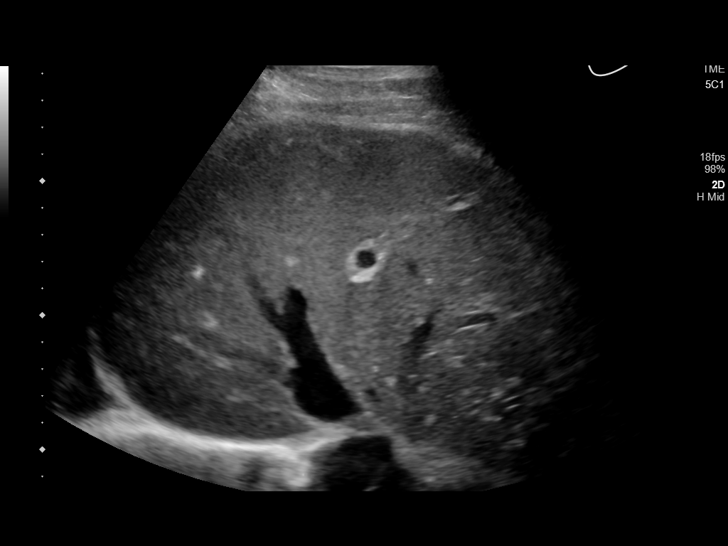
[im 52/57]
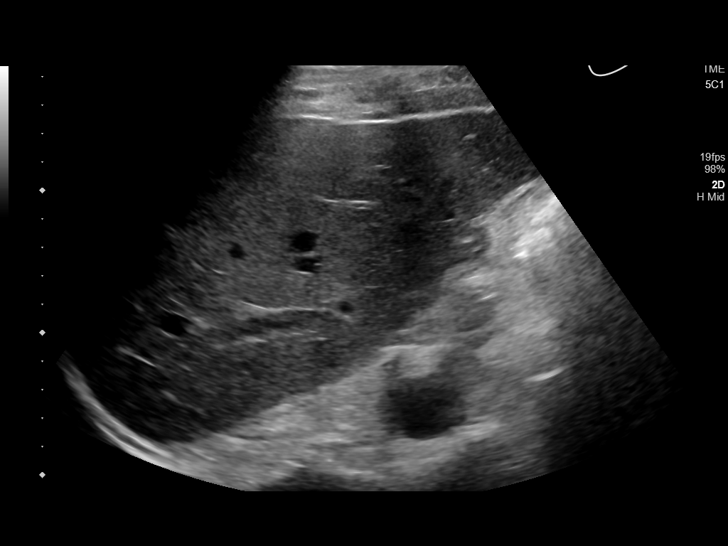
[im 57/57]
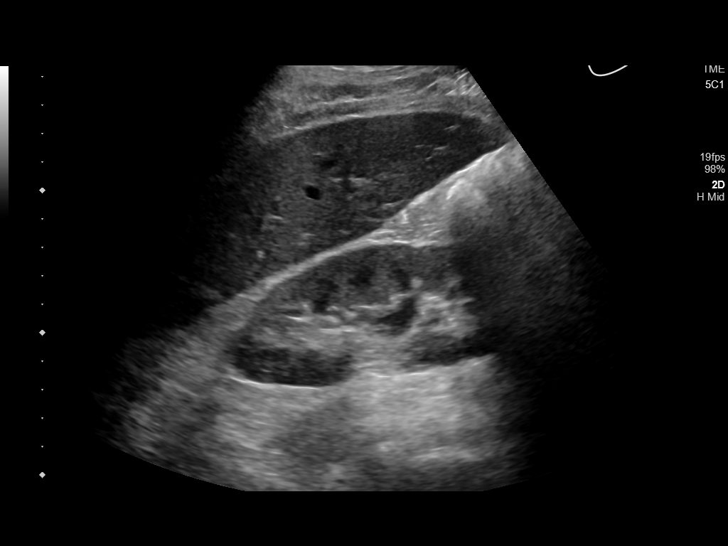

[14 of 25 positions shown; findings below may reference images not displayed]

FINDINGS: Gallbladder:

Physiologically distended with diffuse gallbladder wall thickening
of 9 mm. Possible trace pericholecystic fluid. No gallstones. No
significant intraluminal sludge. No sonographic Murphy sign noted by
sonographer.

Common bile duct:

Diameter: 2-3 mm, normal.

Liver:

No focal lesion identified. Within normal limits in parenchymal
echogenicity. Portal vein is patent on color Doppler imaging with
normal direction of blood flow towards the liver.

Incidental right pleural effusion.
IMPRESSION: 1. Diffuse gallbladder wall thickening with possible trace
pericholecystic fluid. No gallstones or sonographic Murphy sign.
Gallbladder findings may be secondary to hepatic disease or passive
hepatic congestion versus acalculous cholecystitis.
2. Normal sonographic appearance of the liver. No biliary
dilatation.

## 2019-12-01 IMAGING — DX DG CHEST 1V PORT
2 series · 2 of 2 positions shown · non-contrast
Comparison: 05/29/2015

CLINICAL DATA: Worsening dyspnea for 2-3 days.

EXAM:
PORTABLE CHEST 1 VIEW

[chest ap (1 of 2)]
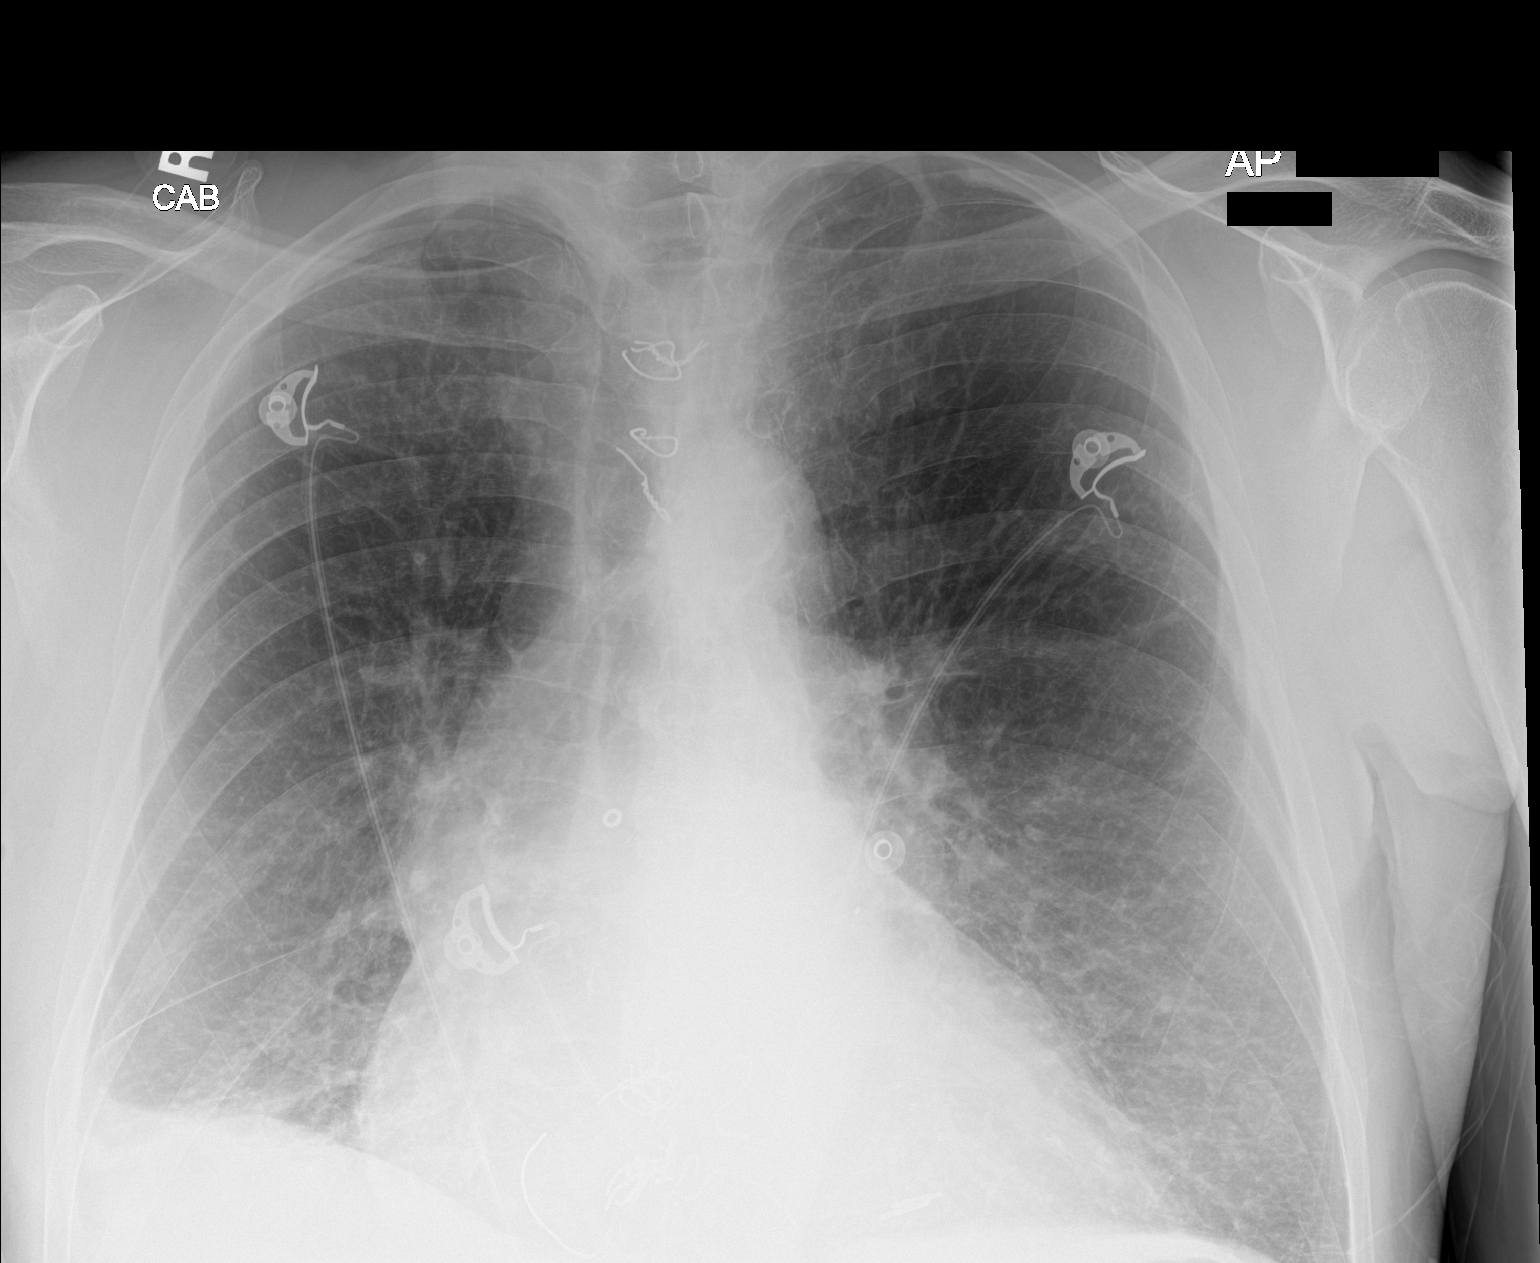

[chest ap (2 of 2)]
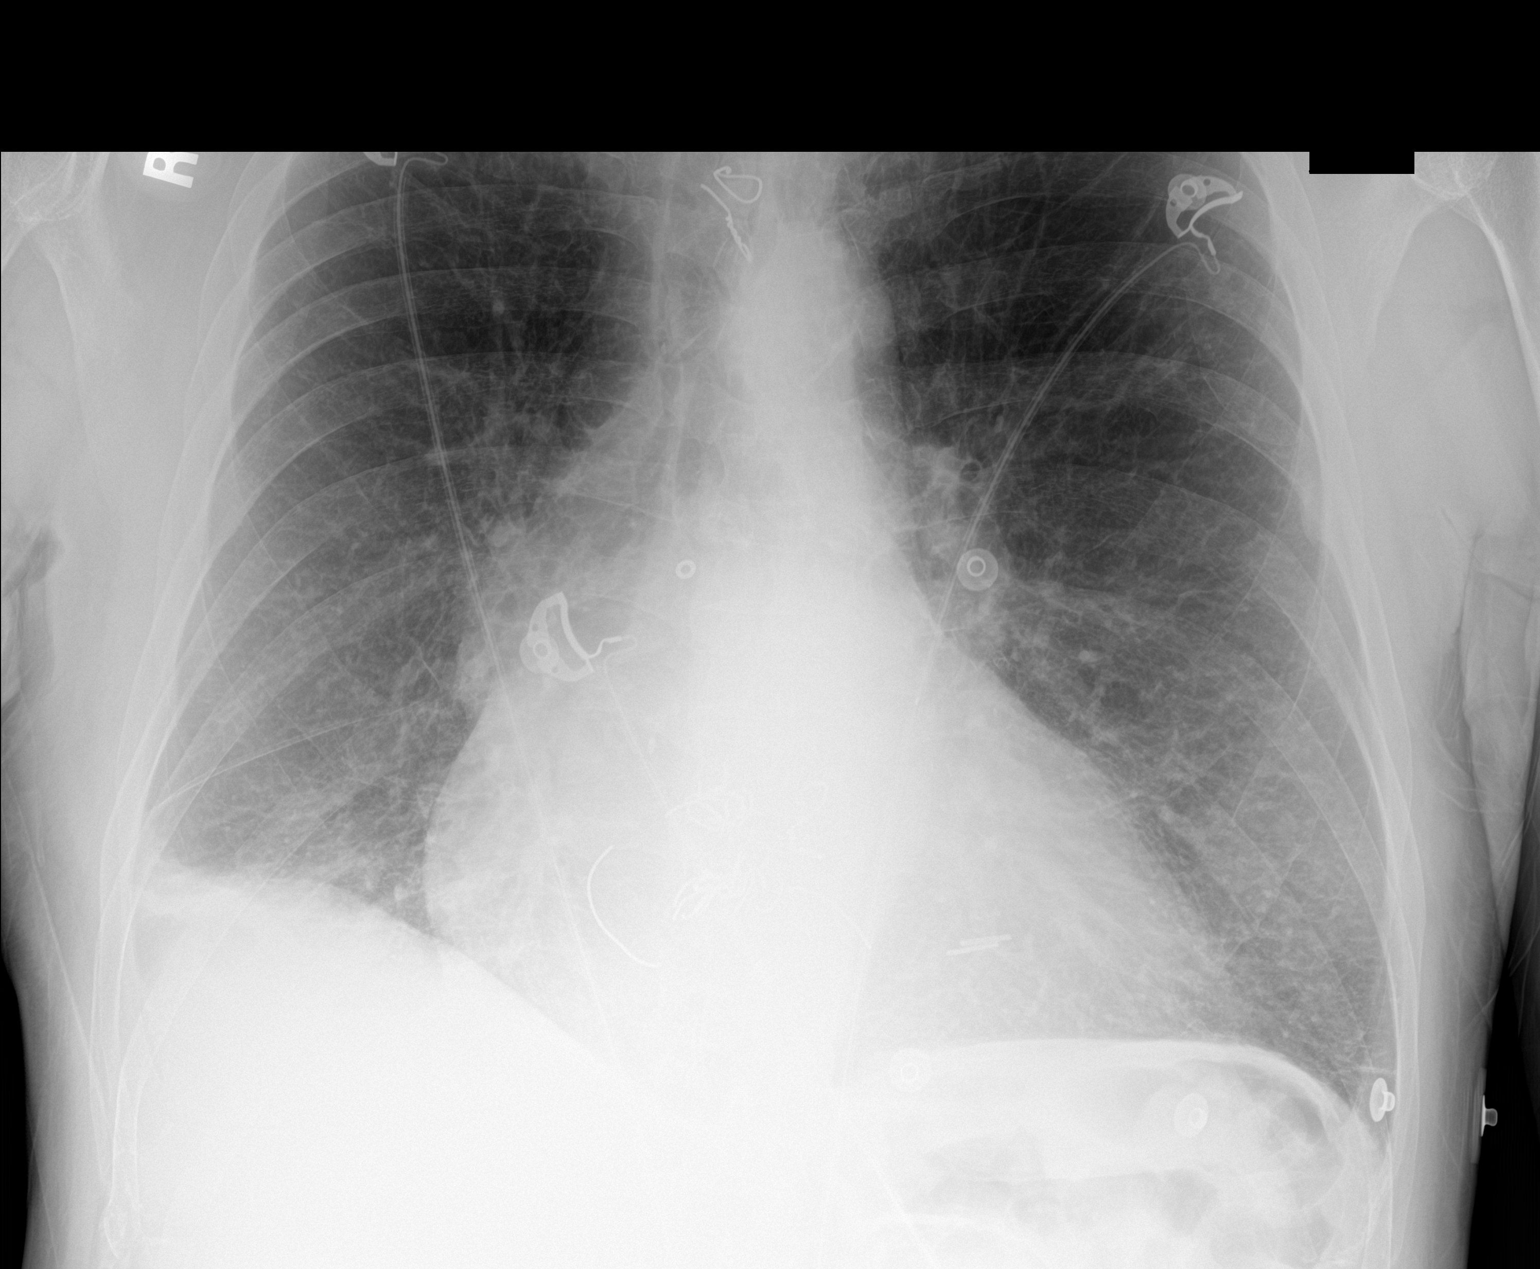

[2 of 2 positions shown; findings below may reference images not displayed]

FINDINGS: Cardiomegaly with aortic atherosclerosis. Emphysematous
hyperinflation of the lungs, upper lobe predominant is
redemonstrated with bibasilar right greater than left atelectasis.
No overt pulmonary edema or pneumothorax. Median sternotomy sutures
are noted similar in appearance. ACDF of the lower included cervical
spine.
IMPRESSION: Cardiomegaly with aortic atherosclerosis and COPD. Bibasilar right
greater than left atelectasis is identified.
